# Patient Record
Sex: Female | Born: 1965 | Race: White | Hispanic: No | Marital: Married | Smoking: Never smoker
Health system: Southern US, Community
[De-identification: ages and names within clinical notes are randomized; demographics above are authoritative.]

## PROBLEM LIST (undated history)

## (undated) DIAGNOSIS — M4300 Spondylolysis, site unspecified: Secondary | ICD-10-CM

## (undated) DIAGNOSIS — R5382 Chronic fatigue, unspecified: Secondary | ICD-10-CM

## (undated) DIAGNOSIS — G9332 Myalgic encephalomyelitis/chronic fatigue syndrome: Secondary | ICD-10-CM

## (undated) DIAGNOSIS — R51 Headache: Secondary | ICD-10-CM

## (undated) DIAGNOSIS — Z5189 Encounter for other specified aftercare: Secondary | ICD-10-CM

## (undated) DIAGNOSIS — I82409 Acute embolism and thrombosis of unspecified deep veins of unspecified lower extremity: Secondary | ICD-10-CM

## (undated) DIAGNOSIS — R519 Headache, unspecified: Secondary | ICD-10-CM

## (undated) DIAGNOSIS — T7840XA Allergy, unspecified, initial encounter: Secondary | ICD-10-CM

## (undated) DIAGNOSIS — I10 Essential (primary) hypertension: Secondary | ICD-10-CM

## (undated) DIAGNOSIS — D689 Coagulation defect, unspecified: Secondary | ICD-10-CM

## (undated) DIAGNOSIS — E079 Disorder of thyroid, unspecified: Secondary | ICD-10-CM

## (undated) HISTORY — DX: Allergy, unspecified, initial encounter: T78.40XA

## (undated) HISTORY — DX: Essential (primary) hypertension: I10

## (undated) HISTORY — DX: Chronic fatigue, unspecified: R53.82

## (undated) HISTORY — DX: Disorder of thyroid, unspecified: E07.9

## (undated) HISTORY — PX: JOINT REPLACEMENT: SHX530

## (undated) HISTORY — DX: Coagulation defect, unspecified: D68.9

## (undated) HISTORY — DX: Myalgic encephalomyelitis/chronic fatigue syndrome: G93.32

## (undated) HISTORY — DX: Headache, unspecified: R51.9

## (undated) HISTORY — DX: Spondylolysis, site unspecified: M43.00

## (undated) HISTORY — DX: Encounter for other specified aftercare: Z51.89

## (undated) HISTORY — DX: Headache: R51

## (undated) HISTORY — DX: Acute embolism and thrombosis of unspecified deep veins of unspecified lower extremity: I82.409

## (undated) HISTORY — PX: OTHER SURGICAL HISTORY: SHX169

## (undated) HISTORY — PX: APPENDECTOMY: SHX54

---

## 1998-12-04 ENCOUNTER — Ambulatory Visit (HOSPITAL_COMMUNITY): Admission: RE | Admit: 1998-12-04 | Discharge: 1998-12-04 | Payer: Self-pay | Admitting: Internal Medicine

## 1998-12-04 ENCOUNTER — Encounter: Payer: Self-pay | Admitting: Internal Medicine

## 1999-01-08 ENCOUNTER — Other Ambulatory Visit: Admission: RE | Admit: 1999-01-08 | Discharge: 1999-01-08 | Payer: Self-pay | Admitting: Internal Medicine

## 1999-04-06 ENCOUNTER — Emergency Department (HOSPITAL_COMMUNITY): Admission: EM | Admit: 1999-04-06 | Discharge: 1999-04-06 | Payer: Self-pay | Admitting: Emergency Medicine

## 1999-08-27 ENCOUNTER — Emergency Department (HOSPITAL_COMMUNITY): Admission: EM | Admit: 1999-08-27 | Discharge: 1999-08-27 | Payer: Self-pay | Admitting: Emergency Medicine

## 1999-08-28 ENCOUNTER — Emergency Department (HOSPITAL_COMMUNITY): Admission: EM | Admit: 1999-08-28 | Discharge: 1999-08-28 | Payer: Self-pay | Admitting: Emergency Medicine

## 1999-09-18 ENCOUNTER — Emergency Department (HOSPITAL_COMMUNITY): Admission: EM | Admit: 1999-09-18 | Discharge: 1999-09-18 | Payer: Self-pay | Admitting: Emergency Medicine

## 1999-09-21 ENCOUNTER — Encounter: Admission: RE | Admit: 1999-09-21 | Discharge: 1999-09-21 | Payer: Self-pay | Admitting: Family Medicine

## 2000-01-27 ENCOUNTER — Emergency Department (HOSPITAL_COMMUNITY): Admission: EM | Admit: 2000-01-27 | Discharge: 2000-01-27 | Payer: Self-pay | Admitting: Emergency Medicine

## 2001-05-10 ENCOUNTER — Emergency Department (HOSPITAL_COMMUNITY): Admission: EM | Admit: 2001-05-10 | Discharge: 2001-05-10 | Payer: Self-pay

## 2001-05-10 ENCOUNTER — Encounter: Payer: Self-pay | Admitting: *Deleted

## 2001-08-08 ENCOUNTER — Other Ambulatory Visit: Admission: RE | Admit: 2001-08-08 | Discharge: 2001-08-08 | Payer: Self-pay | Admitting: Obstetrics & Gynecology

## 2001-08-17 ENCOUNTER — Encounter: Admission: RE | Admit: 2001-08-17 | Discharge: 2001-08-17 | Payer: Self-pay | Admitting: Obstetrics & Gynecology

## 2001-08-17 ENCOUNTER — Encounter: Payer: Self-pay | Admitting: Obstetrics & Gynecology

## 2001-11-06 ENCOUNTER — Encounter: Payer: Self-pay | Admitting: Internal Medicine

## 2001-11-06 ENCOUNTER — Encounter: Admission: RE | Admit: 2001-11-06 | Discharge: 2001-11-06 | Payer: Self-pay | Admitting: Internal Medicine

## 2001-12-18 ENCOUNTER — Ambulatory Visit (HOSPITAL_BASED_OUTPATIENT_CLINIC_OR_DEPARTMENT_OTHER): Admission: RE | Admit: 2001-12-18 | Discharge: 2001-12-18 | Payer: Self-pay | Admitting: Orthopedic Surgery

## 2002-12-27 ENCOUNTER — Other Ambulatory Visit: Admission: RE | Admit: 2002-12-27 | Discharge: 2002-12-27 | Payer: Self-pay | Admitting: Obstetrics & Gynecology

## 2003-12-04 ENCOUNTER — Emergency Department (HOSPITAL_COMMUNITY): Admission: EM | Admit: 2003-12-04 | Discharge: 2003-12-05 | Payer: Self-pay | Admitting: Emergency Medicine

## 2003-12-18 ENCOUNTER — Ambulatory Visit: Payer: Self-pay | Admitting: Internal Medicine

## 2003-12-19 ENCOUNTER — Ambulatory Visit: Payer: Self-pay | Admitting: Internal Medicine

## 2005-06-04 ENCOUNTER — Emergency Department (HOSPITAL_COMMUNITY): Admission: EM | Admit: 2005-06-04 | Discharge: 2005-06-04 | Payer: Self-pay | Admitting: Emergency Medicine

## 2005-10-31 ENCOUNTER — Emergency Department (HOSPITAL_COMMUNITY): Admission: EM | Admit: 2005-10-31 | Discharge: 2005-10-31 | Payer: Self-pay | Admitting: Emergency Medicine

## 2006-04-15 ENCOUNTER — Ambulatory Visit (HOSPITAL_BASED_OUTPATIENT_CLINIC_OR_DEPARTMENT_OTHER): Admission: RE | Admit: 2006-04-15 | Discharge: 2006-04-16 | Payer: Self-pay | Admitting: Orthopedic Surgery

## 2006-06-19 ENCOUNTER — Inpatient Hospital Stay (HOSPITAL_COMMUNITY): Admission: AD | Admit: 2006-06-19 | Discharge: 2006-06-19 | Payer: Self-pay | Admitting: Obstetrics & Gynecology

## 2007-01-08 ENCOUNTER — Inpatient Hospital Stay (HOSPITAL_COMMUNITY): Admission: AD | Admit: 2007-01-08 | Discharge: 2007-01-08 | Payer: Self-pay | Admitting: Obstetrics & Gynecology

## 2007-02-02 ENCOUNTER — Ambulatory Visit (HOSPITAL_COMMUNITY): Admission: RE | Admit: 2007-02-02 | Discharge: 2007-02-02 | Payer: Self-pay | Admitting: Obstetrics & Gynecology

## 2007-03-11 ENCOUNTER — Inpatient Hospital Stay (HOSPITAL_COMMUNITY): Admission: AD | Admit: 2007-03-11 | Discharge: 2007-03-11 | Payer: Self-pay | Admitting: Obstetrics & Gynecology

## 2007-08-17 ENCOUNTER — Observation Stay (HOSPITAL_COMMUNITY): Admission: AD | Admit: 2007-08-17 | Discharge: 2007-08-18 | Payer: Self-pay | Admitting: Obstetrics & Gynecology

## 2007-08-18 ENCOUNTER — Other Ambulatory Visit: Payer: Self-pay | Admitting: Obstetrics & Gynecology

## 2010-04-26 ENCOUNTER — Encounter: Payer: Self-pay | Admitting: Obstetrics & Gynecology

## 2010-08-18 NOTE — Op Note (Signed)
NAMECYNDIA, Melinda Ramirez             ACCOUNT NO.:  [id number]   MEDICAL RECORD NO.:  [id number]          PATIENT TYPE:  AMB   LOCATION:  SDC                           FACILITY:  [facility]   PHYSICIAN:  Genia Del, M.D.DATE OF BIRTH:  [birth year]   DATE OF PROCEDURE:  08/18/2007  DATE OF DISCHARGE:                               OPERATIVE REPORT   PREOPERATIVE DIAGNOSES:  A 12 plus weeks' gestation, abnormal Ultra-  Screen, and rupture of membranes.   POSTOPERATIVE DIAGNOSES:  A 12 plus weeks' gestation, abnormal Ultra-  Screen, and rupture of membranes.   PROCEDURE:  Dilatation and evacuation.   SURGEON:  Genia Del, MD   ANESTHESIOLOGIST:  Angelica Pou, MD   PROCEDURE:  Under general anesthesia with endotracheal intubation, the  patient is in lithotomy position.  She is prepped with [employer] on  the suprapubic, vulvar, and vaginal areas and draped as usual.  The  bladder is catheterized.  The vaginal exam reveals an anteverted uterus,  about 12 cm, mobile, no adnexal mass.  We inserted the speculum in the  vagina.  The anterior lip of the cervix is grasped with a tenaculum.  We  make a paracervical block with lidocaine 1%, 20 mL.  Dilatation of the  cervix easily done up to Hegar dilator, #41.  We then used a #12 curved  suction curette.  The curette is inserted in the intrauterine cavity.  The placenta is suctioned first.  Products of conception corresponding  to 12 weeks are evacuated and sent to pathology and genetics.  We then  use a sharp curette and proceed with systematic curettage of the  intrauterine cavity on all surfaces.  The uterine sound is heard  throughout.  To confirm that the cavity is emptied, we use the  ultrasound, and a thin endometrial line is seen.  The instruments are  removed.  Hemostasis is adequate.  The estimated blood loss is 75 mL.  No complication occurs, and the patient is transferred to recovery room  in good stable  status.      Genia Del, M.D.  Electronically Signed     ML/MEDQ  D:  08/18/2007  T:  08/19/2007  Job:  [id number]

## 2010-08-21 NOTE — Op Note (Signed)
NAME:  Melinda Ramirez, Melinda Ramirez                       ACCOUNT NO.:  [id number]   MEDICAL RECORD NO.:  [id number]                   PATIENT TYPE:  AMB   LOCATION:  DSC                                  FACILITY:  [facility]   PHYSICIAN:  Harvie Junior, M.D.                DATE OF BIRTH:  [birth year]   DATE OF PROCEDURE:  12/18/2001  DATE OF DISCHARGE:                                 OPERATIVE REPORT   PREOPERATIVE DIAGNOSES:  Impingement, left shoulder, with questionable  labral tear.   POSTOPERATIVE DIAGNOSES:  1. Impingement, left shoulder, with questionable labral tear.  2. Sublabral hole, left shoulder.   PRINCIPAL PROCEDURE:  Anterolateral acromioplasty with release of CA  ligament and debridement of subacromial bursa.   SURGEON:  Harvie Junior, M.D.   ASSISTANT:  Currie Paris. Thedore Mins.   ANESTHESIA:  General.   BRIEF HISTORY AND PHYSICAL:  The patient is a 45 year old female with a long  history of having a falling injury onto her left shoulder.  She ultimately  was evaluated and felt to have some sort of a bruise versus impingement  versus rotator cuff tear.  Ultimately, she underwent MRI which showed that  the rotator cuff was intact.  She had a distal clavicle fracture where she  had some thickening of the rotator cuff.  We ultimately discussed the  treatment options with her at that point, including rest and physical  therapy and felt that rest would be the most appropriate course of action,  given that she did have a distal clavicle fracture.  The patient continued  to have significant pain.  Because of her continuous, unrelenting pain, we  ultimately discussed injection therapy.  She did not wish to have steroid  injection of the shoulder, so we ultimately injected her with a local  anesthetic.  She got complete and excellent relief with a Marcaine block in  the subacromial space.  Because of this pain relief, we ultimately did a  second injection, and she got similar pain  relief.  Because of continued  complaints of pain and her fear of having a prolonged recovery and  ultimately needing surgery, she was ultimately taken to the operating room  for surgical intervention procedure.   DESCRIPTION OF PROCEDURE:  The patient was taken to the operating room after  adequate anesthesia was obtained.  The patient was placed on the operating  table.  The left arm was then examined under anesthesia.  The examination  under anesthesia showed that there was no tendency towards dislocation or  subluxation.  She did have the appropriate translation of the shoulder.  This was compared to the opposite side, and the two shoulders appeared to  have similar amounts of anterior translation.  At this point, attention was  turned to the left shoulder which was prepped and draped in the usual  sterile fashion.  Following this, routine arthroscopy revealed that there  was an obvious laxity to the glenohumeral joint.  Ultimately, there was a  positive drop-through sign.  The labral tube structures were evaluated and  felt to be intact; otherwise, a sublabral hole anteriorly, but we could  easily put the arm through a range of motion and felt a tightening on the  middle glenohumeral ligament of this attachment of the labrum and felt that  this was most appropriate to be left alone.  The biceps tendon was evaluated  thoroughly and noted to be intact and also functioning fine.  The posterior  labrum was evaluated with intact glenohumeral joint and no evidence of  arthritis.  The rotator cuff was evaluated and noted to be intact with no  evidence of undersurface fraying.  At this point, the cannula was removed  from the glenohumeral joint and turned into the subacromial space.  There  was a quite tenacious subacromial bursa.  The CA ligament was released, and  the subacromial bursectomy was performed.  It was a fairly tenacious, thick  veil with some popping and catching, especially out  laterally.  An  anterolateral acromioplasty was then performed from both the lateral portal  as well as the anterior portal.  The distal clavicle was not exposed, given  that there was no impingement from the distal clavicle and no preoperative  AC arthritis.  At this point, the rotator cuff was evaluated thoroughly from  the superior surface, and no evidence of rents or tears.  A final check was  made of the acromioplasty.  Debridement was undertaken of the bursa  anterior, posterior, and out laterally.  Once the bursectomy had been  completed, the patient was taken to the recovery room after closure of her  portals with nylon interrupted sutures.  A sterile compressive dressing was  applied.  She was taken to the recovery room and was noted to be in  satisfactory condition.                                                Harvie Junior, M.D.    Ranae Plumber  D:  12/18/2001  T:  12/18/2001  Job:  [id number]

## 2010-08-21 NOTE — Op Note (Signed)
Melinda Ramirez, Melinda Ramirez             ACCOUNT NO.:  [id number]   MEDICAL RECORD NO.:  [id number]          PATIENT TYPE:  AMB   LOCATION:  DSC                          FACILITY:  [facility]   PHYSICIAN:  Harvie Junior, M.D.   DATE OF BIRTH:  14-Oct-1965   DATE OF PROCEDURE:  04/15/2006  DATE OF DISCHARGE:                               OPERATIVE REPORT   PREOPERATIVE DIAGNOSES:  Impingement and acromioclavicular joint  arthritis.   POSTOPERATIVE DIAGNOSES:  1. Impingement.  2. Acromioclavicular joint arthritis.  3. Anterior and posterior labral tear.   PRINCIPLE PROCEDURES:  1. Arthroscopic subacromial decompression.  2. Distal clavicle resection.  3. Debridement within the glenohumeral joint of undersurface rotator      cuff tear, anterior labral tear and posterior labral tear.   SURGEON:  Harvie Junior, M.D.   ASSISTANT:  Marshia Ly, P.A.   ANESTHESIA:  General.   BRIEF HISTORY:  Melinda Ramirez is a 45 year old female with a long history  of having had a left shoulder arthroscopy in the past.  She ultimately  had been complaining of increasing pain in the right shoulder.  She had  an MRI, which showed no rotator cuff tear, but impingement and AC joint  narrowing.  We talked about treatment options and ultimately felt that a  subacromial decompression would be appropriate, given that she had  failed injection therapy and activity modification.  The patient was  taken to the operating room for this procedure.   PROCEDURES:  The patient was taken to the operating room, and after  adequate anesthesia was obtained with a general anesthetic, the patient  was placed on the operating table, and the right shoulder was prepped  and draped in the usual sterile fashion after the patient had been moved  into the beach-chair position and all bony prominences had been well  padded.  Following this, the shoulder was prepped and draped in the  usual sterile fashion, care being taken to avoid  Betadine, as there was  some question about skin irritation with this in the past.  Routine  arthroscopic examination of the shoulder then revealed there was no  significant glenohumeral arthritis.  There was an undersurface rotator  cuff tear at the leading edge of the supraspinatus, which was debrided  back to bleeding tissue, and there really at that point did not appear  to be any high-grade partial tear or full-thickness tear.  At this  point, attention was turned to the labrum.  The anterior labrum and the  midbody had some fraying, which was debrided.  The posterior superior  labrum had some fraying, which was debrided.  The glenohumeral joint was  evaluated again and the biceps tendon looked pristine.  At this point,  attention was turned now to the glenohumeral joint and subacromial  space.  A subtotal bursectomy was performed.  An anterolateral  acromioplasty was performed from the lateral and posterior compartment  with a cutting block technique.  Attention was then turned to the distal  clavicle for  distal clavicle resection of 17 mm through a lateral and anterior  portal.  At this point, a shaver was used to shave off all the bone dust  that was in the shoulder and do a subtotal bursectomy.  The patient was  then taken to recovery, where she was noted to be in a satisfactory  condition.  The estimated blood loss for the procedure was negligible.      Harvie Junior, M.D.  Electronically Signed     JLG/MEDQ  D:  04/15/2006  T:  04/16/2006  Job:  [id number]

## 2010-12-30 LAB — CBC
Hemoglobin: 13.6
MCV: 89.7
RBC: 4.35
RDW: 13.4
WBC: 7.2

## 2012-02-18 DIAGNOSIS — Z7901 Long term (current) use of anticoagulants: Secondary | ICD-10-CM | POA: Insufficient documentation

## 2012-07-24 DIAGNOSIS — E785 Hyperlipidemia, unspecified: Secondary | ICD-10-CM | POA: Insufficient documentation

## 2012-07-27 DIAGNOSIS — M47817 Spondylosis without myelopathy or radiculopathy, lumbosacral region: Secondary | ICD-10-CM | POA: Insufficient documentation

## 2012-07-27 DIAGNOSIS — M51369 Other intervertebral disc degeneration, lumbar region without mention of lumbar back pain or lower extremity pain: Secondary | ICD-10-CM | POA: Insufficient documentation

## 2012-07-27 DIAGNOSIS — M47812 Spondylosis without myelopathy or radiculopathy, cervical region: Secondary | ICD-10-CM | POA: Insufficient documentation

## 2012-07-27 DIAGNOSIS — M5136 Other intervertebral disc degeneration, lumbar region: Secondary | ICD-10-CM | POA: Insufficient documentation

## 2013-01-10 DIAGNOSIS — F518 Other sleep disorders not due to a substance or known physiological condition: Secondary | ICD-10-CM | POA: Insufficient documentation

## 2013-01-10 DIAGNOSIS — G8929 Other chronic pain: Secondary | ICD-10-CM | POA: Insufficient documentation

## 2013-05-24 DIAGNOSIS — G4710 Hypersomnia, unspecified: Secondary | ICD-10-CM | POA: Insufficient documentation

## 2013-07-24 DIAGNOSIS — M797 Fibromyalgia: Secondary | ICD-10-CM | POA: Insufficient documentation

## 2013-11-06 LAB — PULMONARY FUNCTION TEST

## 2013-11-14 ENCOUNTER — Ambulatory Visit (INDEPENDENT_AMBULATORY_CARE_PROVIDER_SITE_OTHER)
Admission: RE | Admit: 2013-11-14 | Discharge: 2013-11-14 | Disposition: A | Discharge status: Home / Self Care | Payer: 59 | Source: Ambulatory Visit | Attending: Internal Medicine | Admitting: Internal Medicine

## 2013-11-14 ENCOUNTER — Other Ambulatory Visit (INDEPENDENT_AMBULATORY_CARE_PROVIDER_SITE_OTHER): Payer: 59

## 2013-11-14 ENCOUNTER — Encounter: Payer: Self-pay | Admitting: Internal Medicine

## 2013-11-14 ENCOUNTER — Ambulatory Visit (INDEPENDENT_AMBULATORY_CARE_PROVIDER_SITE_OTHER): Payer: 59 | Admitting: Internal Medicine

## 2013-11-14 ENCOUNTER — Ambulatory Visit (INDEPENDENT_AMBULATORY_CARE_PROVIDER_SITE_OTHER): Payer: 59 | Admitting: Emergency Medicine

## 2013-11-14 ENCOUNTER — Telehealth: Payer: Self-pay | Admitting: Internal Medicine

## 2013-11-14 VITALS — BP 134/86 | HR 80 | Temp 98.0°F | Ht 64.5 in | Wt 214.2 lb

## 2013-11-14 VITALS — BP 120/85 | HR 125 | Temp 97.5°F | Resp 18 | Ht 64.5 in | Wt 211.8 lb

## 2013-11-14 DIAGNOSIS — G4700 Insomnia, unspecified: Secondary | ICD-10-CM | POA: Insufficient documentation

## 2013-11-14 DIAGNOSIS — R0989 Other specified symptoms and signs involving the circulatory and respiratory systems: Secondary | ICD-10-CM

## 2013-11-14 DIAGNOSIS — E039 Hypothyroidism, unspecified: Secondary | ICD-10-CM

## 2013-11-14 DIAGNOSIS — R0609 Other forms of dyspnea: Secondary | ICD-10-CM

## 2013-11-14 DIAGNOSIS — R0902 Hypoxemia: Secondary | ICD-10-CM | POA: Insufficient documentation

## 2013-11-14 DIAGNOSIS — Z86711 Personal history of pulmonary embolism: Secondary | ICD-10-CM

## 2013-11-14 DIAGNOSIS — I82409 Acute embolism and thrombosis of unspecified deep veins of unspecified lower extremity: Secondary | ICD-10-CM

## 2013-11-14 DIAGNOSIS — R000 Tachycardia, unspecified: Secondary | ICD-10-CM

## 2013-11-14 DIAGNOSIS — R0600 Dyspnea, unspecified: Secondary | ICD-10-CM

## 2013-11-14 DIAGNOSIS — G894 Chronic pain syndrome: Secondary | ICD-10-CM | POA: Insufficient documentation

## 2013-11-14 LAB — POCT CBC
GRANULOCYTE PERCENT: 53 % (ref 37–80)
HEMATOCRIT: 43.4 % (ref 37.7–47.9)
Hemoglobin: 14 g/dL (ref 12.2–16.2)
Lymph, poc: 2.2 (ref 0.6–3.4)
MCH: 29.9 pg (ref 27–31.2)
MCHC: 32.4 g/dL (ref 31.8–35.4)
MCV: 92.5 fL (ref 80–97)
MID (CBC): 0.4 (ref 0–0.9)
MPV: 6.7 fL (ref 0–99.8)
POC Granulocyte: 2.9 (ref 2–6.9)
POC LYMPH PERCENT: 40.4 %L (ref 10–50)
POC MID %: 6.6 %M (ref 0–12)
Platelet Count, POC: 231 10*3/uL (ref 142–424)
RBC: 4.69 M/uL (ref 4.04–5.48)
RDW, POC: 13.4 %
WBC: 5.4 10*3/uL (ref 4.6–10.2)

## 2013-11-14 LAB — BASIC METABOLIC PANEL
BUN: 10 mg/dL (ref 6–23)
CALCIUM: 9.4 mg/dL (ref 8.4–10.5)
CO2: 25 meq/L (ref 19–32)
CREATININE: 0.7 mg/dL (ref 0.4–1.2)
Chloride: 99 mEq/L (ref 96–112)
GFR: 100.61 mL/min (ref >60.00)
GLUCOSE: 105 mg/dL — AB (ref 70–99)
POTASSIUM: 4.1 meq/L (ref 3.5–5.1)
Sodium: 133 mEq/L — ABNORMAL LOW (ref 135–145)

## 2013-11-14 LAB — TSH: TSH: 0.2 u[IU]/mL — ABNORMAL LOW (ref 0.35–4.50)

## 2013-11-14 LAB — D-DIMER, QUANTITATIVE: D-Dimer, Quant: 0.27 ug/mL-FEU (ref 0.00–0.48)

## 2013-11-14 LAB — BRAIN NATRIURETIC PEPTIDE: Pro B Natriuretic peptide (BNP): 15 pg/mL (ref 0.0–100.0)

## 2013-11-14 NOTE — Assessment & Plan Note (Signed)
No evidence active clot at present though clearly at risk based on freq travel, obesty, and h/o DVT

## 2013-11-14 NOTE — Progress Notes (Signed)
Subjective:    Patient ID: Melinda Ramirez, female    DOB: [birth year]  MRN: [id number]  HPI  [id number] yowf never smoker / [employer] in environmental science on prn lovenox per [facility] with hx 1st week of Aug 3rd 2015 02 dropped / pulse up / dizzy with hbp > [facility] rx with 02 and nl v/q and bubble study all reported nl and d/c off 02, not change rx,  Felt better but not very active then am 11/14/2013 felt same sensation with low 02 sats with nl D dimer at Dr Ellis Parentsaub's office so sent to pulmonary clinic 11/14/2013   Last simlar episode June 2015 recurred  over sev days and resolved on its own.  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 Developed right leg swelling and pain 17 days after an arthroscopic right ACL repair. ii. Bilateral leg [facility] showed the presence of DVT from the right mid-femoral to the posterior tibial vein.  (1) V/Q scan reportedly interpreted as "negative" for PE iii. Was admitted, started on enoxaparin 05/28/09 and then warfarin 05/29/09.  iv. Due to her clot burden and travel requirements, was recommended to undergo thrombolysis/thrombectomy.  v. Was [facility] and treatment.  vi. Ultrasound showed the presence of an occlusive thrombus in the right popliteal vein, without thrombosis in the left leg, 06/01/09 vi. MRV showed compression of the left common iliac vein with proximal focal dilatation and compression of the right common iliac vein by the uterus and a right 2.1 cm adnexal cyst.  vii. Bilateral ultrasound performed 06/04/09 (1) Right leg showed the presence of deep vein thrombosis in the right mid-/distal femoral, popliteal, posterior  tibial, and peroneal veins (2) Left leg showed no thrombosis.  viii. Limited thrombophilia evaluation unrevealing: (1) No evidence of activated protein C resistance.  (2) Antithrombin activity 99% (3) No evidence of a lupus anticoagulant or anticardiolipin antibodies ix. Due to persistent symptoms while on therapeutic warfarin, decision made to pursue placement of an IVC filter, popliteal venogram with thrombectomy and thrombolysis.  x. Bilateral leg ultrasound performed, 06/26/09 (1) Right leg showed incompetent deep venous system without an acute DVT and chronic, recanalized popliteal DVT.  (2) Left leg showed a normal study xi. Developed persistent abdominal pain without any overt findings on repeated CT scans of the abdomen, felt that perhaps misalignment of the IVC filter might be contributing to her symptoms of severe pain and radiculopathy. (1) Noted that the prongs of the filter were penetrating the IVC wall and were in close proximity to the lumbar vertebrae, without evidence of hemorrhage or erosion.  xii. Underwent removal of the IVC filter with subsequent chronic radicular pain, 06/28/09 xiii. Right lower extremity ultrasound showed evidence of chronic deep venous thrombosis only in one of the duplicated popliteal veins with chronic appearing nonocclusive thrombus with linear septation and incomplete color fill and compression. Otherwise the deep and superficial venous systems including calf veins were patent without evidence of thrombosis, 11/03/2009 xiv. Subsequent venogram showed no stenosis leading to discontinuation of warfarin by Vascular Surgery, 03/2010. xv. Ultrasound was negative for DVT in the left leg 10/17/2010. xvi. Repeat bilateral lower extremity ultrasound showed no evidence of venous thrombosis, 02/08/2011. xvii. Evaluated in  the Hematology Clinic, where it was felt that based upon her history, physical examination, and the available laboratory data, there was no reason  to warrant resuming therapeutic anticoagulation, 03/24/11 (1) Recommend thromboprophylaxis in settings of increased thrombotic risk.  xviii. MRV showed no evidence for acute or chronic deep venous thrombosis in the abdomen, pelvis, and bilateral lower extremities, 06/05/11. (1) Incidental note made of right greater than left prominent superficial veins in the proximal calves and distal thighs without evidence for central venous stenosis. b. 2nd episode, 01/22/12 i. Developed cough and dyspnea the day following the RFA of her left GSV. ii. Presented to [facility] and noted to have a room air pulse oximetry of 95% and referred to the nearest ER.  iii. Patient presented to the [facility] ER and noted to have a very low probability V/Q scan.  iv. Ultrasound of the left leg showed evidence of acute obstruction in the left common femoral vein, with left GSV thrombus noted after ablation.  v. Was started on unfractionated heparin, transitioned to enoxaparin, and then bridged to warfarin as an out-patient. vi. Follow-up ultrasound of the left leg showed no evidence of acute or chronic deep or superficial vein thrombosis, with the segment of the great saphenous vein which was  treated being occluded, 02/14/12 vii. Warfarin stopped 03/08/12      11/14/2013 1st [facility] office visit/ Ennio Houp  Chief Complaint  Patient presents with  . Pulmonary Consult    Referred by Dr. Lesle Chris.  Pt has hx of PE and DVT- fall 2013.  She states that she has occ hypoxia.    on macrodantin prn post coital and last took it July 16  Walked an hour one day prior to OV  s difficulty but did not check sats Prev when blood clot dx was made at [facility] based on clot in leg and low 02 level but no def finding on V/Q  No obvious other patterns in day to day or daytime variabilty or assoc chronic cough or cp or chest tightness, subjective wheeze overt sinus or hb symptoms. No unusual exp hx or h/o childhood pna/ asthma or  knowledge of premature birth.  Sleeping ok without nocturnal  or early am exacerbation  of respiratory  c/o's or need for noct saba. Also denies any obvious fluctuation of symptoms with weather or environmental changes or other aggravating or alleviating factors except as outlined above   Current Medications, Allergies, Complete Past Medical History, Past Surgical History, Family History, and Social History were reviewed in [employer] record.           Review of Systems  Constitutional: Negative for fever, chills and unexpected weight change.  HENT: Negative for congestion, dental problem, ear pain, nosebleeds, postnasal drip, rhinorrhea, sinus pressure, sneezing, sore throat, trouble swallowing and voice change.   Eyes: Negative for visual disturbance.  Respiratory: Negative for cough, choking and shortness of breath.   Cardiovascular: Positive for palpitations. Negative for chest pain and leg swelling.  Gastrointestinal: Negative for vomiting, abdominal pain and diarrhea.  Genitourinary: Negative for difficulty urinating.  Musculoskeletal: Positive for arthralgias.  Skin: Negative for rash.  Neurological: Negative for tremors, syncope and headaches.  Hematological: Does not bruise/bleed easily.       Objective:   Physical Exam  Wt Readings from Last 3 Encounters:  11/14/13 214 lb 3.2 oz (97.16 kg)  11/14/13 211 lb 12.8 oz (96.072 kg)     amb wf nad  HEENT: nl dentition,  turbinates, and orophanx. Nl external ear canals without cough reflex   NECK :  without JVD/Nodes/TM/ nl carotid upstrokes bilaterally   LUNGS: no acc muscle use, clear to A and P bilaterally without cough on insp or exp maneuvers   CV:  RRR  no s3 or murmur or increase in P2, no edema   ABD:  soft and nontender with nl excursion in the supine position. No bruits or organomegaly, bowel sounds nl  MS:  warm without deformities, calf tenderness, cyanosis or clubbing  SKIN: warm  and dry without lesions    NEURO:  alert, approp, no deficits    CXR  11/14/2013 :   The heart size and mediastinal contours are within normal limits.  Both lungs are clear. The visualized skeletal structures are  unremarkable.      Assessment & Plan:

## 2013-11-14 NOTE — Assessment & Plan Note (Signed)
11/14/2013 TSH  0.20 on 60 gr of thyroid > referred back to Dr Cleta Albertsaub for adjustment

## 2013-11-14 NOTE — Patient Instructions (Addendum)
It is unlikely that you now have a blood clot but you have a high risk of having another one and we have no other explanation for your recurrent clots and to prevent them you should be on xarelto indefinitely and hope that you will reconsider and see your hematology doctors at [facility] in concert with a pulmonary opinion there as well (we could set this up after I have a chance to review your [place] records)   We will request your echo report/ VQ results and cxr reports/ your PFT's from [place]  I would avoid further macrodantin indefinitely   Work on wt loss by paced exercise   Please remember to go to the lab and x-ray department downstairs for your tests - we will call you with the results when they are available.     .Marland Kitchen

## 2013-11-14 NOTE — Assessment & Plan Note (Addendum)
11/14/2013  Walked RA x 3 laps @ 185 ft each stopped due to  End of study, sat 88, minimal symptoms of sob/ lightheaded, recovered quickly - w/u in [place] 1st week of Aug 2015 requested   Interesting aspect of hx is absence of desats while on anticoagulation per pt hx and now episodic desats with nl d dimer.  While a  nl valute  may miss small peripheral pe, the clot burden with sob/ desats is moderately high and the d dimer has a very high neg pred value in this setting.  She really needs a non contrasted HRCT to explain this in the absence of any evidence of PAH or R to L shunt on echo in [place] and regardless of whether PE explains her problem, she is at risk of very catstrophic PE from future potential events as can't even keep her sats up well in the absence of proof of them now.   I strongly rec she return to [facility] heme and pulmonary dept's for a re-eval or go ahead and restart rx in form of xarelto now but she declined both.

## 2013-11-14 NOTE — Progress Notes (Addendum)
This chart was scribed for Collene GobbleSteven A Draedyn Weidinger, MD by Charline BillsEssence Howell, ED Scribe. The patient was seen in room 11. Patient's care was started at 9:02 AM.  Subjective:    Patient ID: Melinda Ramirez, female    DOB: 1965-11-17, 48 y.o.   MRN: [id number]  Chief Complaint  Patient presents with  . other    pt would like to see why her o2 level is low.  when pt came in her o2 was at 7587.  It eventually went up to 97  . Headache    x3 weeks   HPI HPI Comments: Melinda ClearJessica M Ramirez is a 48 y.o. female, with a h/o clotting disorder and HTN, who presents to the [facility] complaining of intermittent HA over the past 3 weeks. Pt has followed up with her neurologist regarding HAs that he attributes to fluctuations in BP. He advised pt tp follow up with PCP and begin propranolol.   Pt states that she has had a few DVTs since she was last seen. She states that she had IVC filters which resulted in spinal and nerve damage. Pt reports a 3 ft long blood clot from her abdomen to her ankle. She states that the filter was placed and removed 1 ft of the clot but the rest of the clot remained above the knee. She reports her second blood clot in her thigh which caused fatigue, hallucinations and an O2 of 84. Pt's third blood clot was in a superficial vein following a flight. Pt states that she stops every hour to walk for 10 minutes when she is driving to prevent clots. Pt suspects floating microemboli. Her blood clots are managed by [facility] coagulation clinic. Pt is not currently taking blood thinners.  Pt was hospitalized a few days ago in [place] for dizziness, low O2 levels and increased pulse. Pt had an echo, EKG and bubble study done that did not show a shunt. Pt states that she has an oxygen monitor at home with readings that Ramirez from 86-99. Pt's O2 level was 87 when she came in today but states that her O2 levels improved to 97 after taking a few deep breaths. Pt states that she has had a pulmonary  function test that showed great lung capacity. She also states that albuterol does not improve her O2 levels.  Pt also reports insomnia and chronic fatigue which was managed by neuropsychiatrist Dr. Nedra HaiLee in [place]; pt no longer sees him. Pt's pain management in [place]. No cardiologist per patient.    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 Although she is currently disabled, she expresses her desire to return to work. Pt states that she has been traveling and living in different areas such as [place], [place], [place] and recently moved back to [place].   Past Medical History  Diagnosis Date  . Allergy   . Blood transfusion without reported diagnosis   . Clotting disorder   . Hypertension   . Thyroid disease    No current outpatient prescriptions on file prior to visit.   No current facility-administered medications on file prior to visit.   Allergies  Allergen Reactions  . Gadolinium Derivatives   . Iohexol      Code: HIVES   . Prednisone Hives  . Sulfur Rash   Review of Systems  Constitutional: Positive for fatigue (chronic).  Neurological: Positive for headaches.  Psychiatric/Behavioral: Positive for sleep disturbance (chronic).  Objective:   Physical Exam CONSTITUTIONAL: Well developed/well nourished HEAD: Normocephalic/atraumatic EYES: EOMI/PERRL ENMT: Mucous membranes moist NECK: supple no meningeal signs SPINE: entire spine nontender CV: S1/S2 noted, no murmurs/rubs/gallops noted LUNGS: Lungs are Ramirez to auscultation bilaterally, no apparent distress ABDOMEN: soft, nontender, no rebound or guarding GU: no cva tenderness NEURO: Pt is awake/alert, moves all extremitiesx4 EXTREMITIES: pulses normal, full ROM SKIN: warm, color normal PSYCH: no abnormalities of mood noted Results for orders placed in visit on 11/14/13  POCT CBC      Result Value Ref Ramirez   WBC 5.4  4.6 - 10.2 K/uL   Lymph, poc 2.2  0.6 - 3.4   POC LYMPH  PERCENT 40.4  10 - 50 %L   MID (cbc) 0.4  0 - 0.9   POC MID % 6.6  0 - 12 %M   POC Granulocyte 2.9  2 - 6.9   Granulocyte percent 53.0  37 - 80 %G   RBC 4.69  4.04 - 5.48 M/uL   Hemoglobin 14.0  12.2 - 16.2 g/dL   HCT, POC 16.1  09.6 - 47.9 %   MCV 92.5  80 - 97 fL   MCH, POC 29.9  27 - 31.2 pg   MCHC 32.4  31.8 - 35.4 g/dL   RDW, POC 04.5     Platelet Count, POC 231  142 - 424 K/uL   MPV 6.7  0 - 99.8 fL      Assessment & Plan:ot  Very complicated history. She has had 3 episodes of clots.. She had previous placement of a vena cava filter with complications. She was  hospitalized last week because of tachycardia and hypoxia. No definite etiology was found however showering of emboli was considered. Apparently she did have an echo and no definite PFO was found. She has been followed at the coag clinic at [facility]. Still would worry but the possibility of showering emboli or a right to left shift. When she was ambulated her heart rate rose to between 110 and 120 with a drop in oxygen to 85%. We'll try and get an opinion from the pulmonologist regarding further management and evaluation. Stat CBC and d-dimer were done.. she will see Dr. Sherene Sires at 1:30 I personally performed the services described in this documentation, which was scribed in my presence. The recorded information has been reviewed and is accurate.

## 2013-11-15 NOTE — Progress Notes (Signed)
Quick Note:  Spoke with pt and notified of results per Dr. Wert. Pt verbalized understanding and denied any questions.  ______ 

## 2013-11-16 NOTE — Telephone Encounter (Signed)
Please advise if this can be signed off MW thanks

## 2013-11-19 NOTE — Telephone Encounter (Signed)
Will sign off--

## 2013-11-19 NOTE — Telephone Encounter (Signed)
Yes. Aware, ok to sign off

## 2013-11-26 ENCOUNTER — Encounter: Payer: Self-pay | Admitting: Internal Medicine

## 2013-11-26 ENCOUNTER — Telehealth: Payer: Self-pay

## 2013-11-26 ENCOUNTER — Other Ambulatory Visit: Payer: Self-pay | Admitting: Internal Medicine

## 2013-11-26 DIAGNOSIS — R0902 Hypoxemia: Secondary | ICD-10-CM

## 2013-11-26 NOTE — Telephone Encounter (Signed)
Release form faxed to [facility] medical records at fax # [phone] with confirmation.

## 2013-11-26 NOTE — Telephone Encounter (Signed)
Left message for call back. Due the release form not being completed correctly, the form has not been sent to [facility]. I called patient on 11/22/13 and left message for her to call back so we could have clarification on where we needed to fax her request. I did look up [facility] in [place] to and got medical records phone number. Will call for fax number to send release form.

## 2013-11-26 NOTE — Telephone Encounter (Signed)
Pt states she signed a release for [place] to retrieve her records from [facility] and wanted to know the status, please call pt at [phone] and the phone number to [facility] Is [phone] she doesn't know the fax

## 2013-12-03 ENCOUNTER — Other Ambulatory Visit (HOSPITAL_COMMUNITY): Payer: Medicare Other

## 2013-12-11 ENCOUNTER — Encounter: Payer: Self-pay | Admitting: Internal Medicine

## 2013-12-11 ENCOUNTER — Ambulatory Visit (HOSPITAL_COMMUNITY): Payer: 59 | Attending: Cardiology | Admitting: Cardiology

## 2013-12-11 ENCOUNTER — Encounter (HOSPITAL_COMMUNITY): Payer: Self-pay

## 2013-12-11 ENCOUNTER — Other Ambulatory Visit (HOSPITAL_COMMUNITY): Payer: Self-pay | Admitting: Internal Medicine

## 2013-12-11 DIAGNOSIS — I079 Rheumatic tricuspid valve disease, unspecified: Secondary | ICD-10-CM | POA: Diagnosis not present

## 2013-12-11 DIAGNOSIS — R0902 Hypoxemia: Secondary | ICD-10-CM | POA: Diagnosis not present

## 2013-12-11 DIAGNOSIS — R0602 Shortness of breath: Secondary | ICD-10-CM

## 2013-12-11 DIAGNOSIS — E669 Obesity, unspecified: Secondary | ICD-10-CM | POA: Insufficient documentation

## 2013-12-11 DIAGNOSIS — I059 Rheumatic mitral valve disease, unspecified: Secondary | ICD-10-CM | POA: Insufficient documentation

## 2013-12-11 NOTE — Progress Notes (Signed)
Patient ID: Melinda Ramirez, female   DOB: 09/26/1965, 48 y.o.   MRN: [id number] IV with 22 G angiocath (R) AC x 1, tolerated well, site unremarkable for Echo Bubble Study. Irean Hong, RN.

## 2013-12-11 NOTE — Progress Notes (Signed)
Echo performed. 

## 2013-12-13 ENCOUNTER — Encounter: Payer: Self-pay | Admitting: Internal Medicine

## 2013-12-13 ENCOUNTER — Ambulatory Visit (INDEPENDENT_AMBULATORY_CARE_PROVIDER_SITE_OTHER): Payer: 59 | Admitting: Internal Medicine

## 2013-12-13 VITALS — BP 124/90 | HR 78 | Temp 98.0°F | Ht 65.0 in | Wt 214.0 lb

## 2013-12-13 DIAGNOSIS — I82409 Acute embolism and thrombosis of unspecified deep veins of unspecified lower extremity: Secondary | ICD-10-CM

## 2013-12-13 DIAGNOSIS — Z23 Encounter for immunization: Secondary | ICD-10-CM

## 2013-12-13 DIAGNOSIS — R0902 Hypoxemia: Secondary | ICD-10-CM

## 2013-12-13 DIAGNOSIS — I10 Essential (primary) hypertension: Secondary | ICD-10-CM | POA: Insufficient documentation

## 2013-12-13 MED ORDER — PROPRANOLOL HCL ER 80 MG PO CP24: 80.0000 mg | ORAL_CAPSULE | Freq: Every day | ORAL | Status: DC

## 2013-12-13 MED ORDER — RIVAROXABAN 15 MG PO TABS: 15.0000 mg | ORAL_TABLET | Freq: Two times a day (BID) | ORAL | Status: DC

## 2013-12-13 MED ORDER — RIVAROXABAN 20 MG PO TABS: 20.0000 mg | ORAL_TABLET | Freq: Every day | ORAL | Status: DC

## 2013-12-13 NOTE — Assessment & Plan Note (Signed)
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 Developed right leg swelling and pain 17 days after an arthroscopic right ACL repair. ii. Bilateral leg ultrasound performed at [facility] showed the presence of DVT from the right mid-femoral to the posterior tibial vein.  (1) V/Q scan reportedly interpreted as "negative" for PE iii. Was admitted, started on enoxaparin 05/28/09 and then warfarin 05/29/09.  iv. Due to her clot burden and travel requirements, was recommended to undergo thrombolysis/thrombectomy.  v. Was transferred to [facility] for further evaluation and treatment.  vi. Ultrasound showed the presence of an occlusive thrombus in the right popliteal vein, without thrombosis in the left leg, 06/01/09 vi. MRV showed compression of the left common iliac vein with proximal focal dilatation and compression of the right common iliac vein by the uterus and a right 2.1 cm adnexal cyst.  vii. Bilateral ultrasound performed 06/04/09 (1) Right leg showed the presence of deep vein thrombosis in the right mid-/distal femoral, popliteal, posterior tibial, and peroneal veins (2) Left leg showed no thrombosis.  viii. Limited thrombophilia evaluation unrevealing: (1) No evidence of activated protein C resistance.  (2) Antithrombin activity 99% (3) No evidence of a lupus anticoagulant or anticardiolipin antibodies ix. Due to persistent symptoms while on therapeutic warfarin, decision made to pursue placement of an IVC filter, popliteal venogram with thrombectomy and thrombolysis.  x. Bilateral leg ultrasound performed, 06/26/09 (1) Right leg showed incompetent deep venous system without an acute DVT and chronic, recanalized popliteal DVT.  (2) Left leg showed a normal study xi. Developed persistent abdominal pain without any overt findings on repeated CT scans of the abdomen, felt that perhaps misalignment of the IVC filter might be contributing to her symptoms of severe pain and  radiculopathy. (1) Noted that the prongs of the filter were penetrating the IVC wall and were in close proximity to the lumbar vertebrae, without evidence of hemorrhage or erosion.  xii. Underwent removal of the IVC filter with subsequent chronic radicular pain, 06/28/09 xiii. Right lower extremity ultrasound showed evidence of chronic deep venous thrombosis only in one of the duplicated popliteal veins with chronic appearing nonocclusive thrombus with linear septation and incomplete color fill and compression. Otherwise the deep and superficial venous systems including calf veins were patent without evidence of thrombosis, 11/03/2009 xiv. Subsequent venogram showed no stenosis leading to discontinuation of warfarin by Vascular Surgery, 03/2010. xv. Ultrasound was negative for DVT in the left leg 10/17/2010. xvi. Repeat bilateral lower extremity ultrasound showed no evidence of venous thrombosis, 02/08/2011. xvii. Evaluated in the Hematology Clinic, where it was felt that based upon her history, physical examination, and the available laboratory data, there was no reason to warrant resuming therapeutic anticoagulation, 03/24/11 (1) Recommend thromboprophylaxis in settings of increased thrombotic risk.  xviii. MRV showed no evidence for acute or chronic deep venous thrombosis in the abdomen, pelvis, and bilateral lower extremities, 06/05/11. (1) Incidental note made of right greater than left prominent superficial veins in the proximal calves and distal thighs without evidence for central venous stenosis. b. 2nd episode, 01/22/12 i. Developed cough and dyspnea the day following the RFA of her left GSV. ii. Presented to [facility] and noted to have a room air pulse oximetry of 95% and referred to the nearest ER.  iii. Patient presented [facility] and noted to have a very low probability V/Q scan.  iv. Ultrasound of the left leg showed evidence of acute obstruction in the left common femoral  vein, with left  GSV thrombus noted after ablation.  v. Was started on unfractionated heparin, transitioned to enoxaparin, and then bridged to warfarin as an out-patient. vi. Follow-up ultrasound of the left leg showed no evidence of acute or chronic deep or superficial vein thrombosis, with the segment of the great saphenous vein which was  treated being occluded, 02/14/12 vii. Warfarin stopped 03/08/12  Lab Results  Component Value Date   DDIMER 0.27 11/14/2013    12/13/2013  Started xarelto prophylactically as pt declined to return to [facility] where they had been filling "prn lovenox" but with which I disagreed based on concern of chronic low grade ongoing clotting /risk for recurrence

## 2013-12-13 NOTE — Progress Notes (Signed)
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 rx with 02 and nl v/q and bubble study all reported nl and d/c off 02, not change rx,  Felt better but not very active then am 11/14/2013 felt same sensation with low 02 sats with nl D dimer at Dr Ellis Parentsaub's office so sent to pulmonary clinic 11/14/2013   Last simlar episode June 2015 recurred  over sev days and resolved on its own.  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 Developed right leg swelling and pain 17 days after an arthroscopic right ACL repair. ii. Bilateral leg ultrasound performed at [facility] showed the presence of DVT from the right mid-femoral to the posterior tibial vein.  (1) V/Q scan reportedly interpreted as "negative" for PE iii. Was admitted, started on enoxaparin 05/28/09 and then warfarin 05/29/09.  iv. Due to her clot burden and travel requirements, was recommended to undergo thrombolysis/thrombectomy.  v. Was transferred to [facility] for further evaluation and treatment.  vi. Ultrasound showed the presence of an occlusive thrombus in the right popliteal vein, without thrombosis in the left leg, 06/01/09 vi. MRV showed compression of the left common iliac vein with proximal focal dilatation and compression of the right common iliac vein by the uterus and a right 2.1 cm adnexal cyst.  vii. Bilateral ultrasound performed 06/04/09 (1) Right leg showed the presence of deep vein thrombosis in the right mid-/distal femoral, popliteal, posterior  tibial, and peroneal veins (2) Left leg showed no thrombosis.  viii. Limited thrombophilia evaluation unrevealing: (1) No evidence of activated protein C resistance.  (2) Antithrombin activity 99% (3) No evidence of a lupus anticoagulant or anticardiolipin antibodies ix. Due to persistent symptoms while on therapeutic warfarin, decision made to pursue placement of an IVC filter, popliteal venogram with thrombectomy and thrombolysis.  x. Bilateral leg ultrasound performed, 06/26/09 (1) Right leg showed incompetent deep venous system without an acute DVT and chronic, recanalized popliteal DVT.  (2) Left leg showed a normal study xi. Developed persistent abdominal pain without any overt findings on repeated CT scans of the abdomen, felt that perhaps misalignment of the IVC filter might be contributing to her symptoms of severe pain and radiculopathy. (1) Noted that the prongs of the filter were penetrating the IVC wall and were in close proximity to the lumbar vertebrae, without evidence of hemorrhage or erosion.  xii. Underwent removal of the IVC filter with subsequent chronic radicular pain, 06/28/09 xiii. Right lower extremity ultrasound showed evidence of chronic deep venous thrombosis only in one of the duplicated popliteal veins with chronic appearing nonocclusive thrombus with linear septation and incomplete color fill and compression. Otherwise the deep and superficial venous systems including calf veins were patent without evidence of thrombosis, 11/03/2009 xiv. Subsequent venogram showed no stenosis leading to discontinuation of warfarin by Vascular Surgery, 03/2010. xv. Ultrasound was negative for DVT in the left leg 10/17/2010. xvi. Repeat bilateral lower extremity ultrasound showed no evidence of venous thrombosis, 02/08/2011. xvii. Evaluated in  the Hematology Clinic, where it was felt that based upon her history, physical examination, and the available laboratory data, there was no reason  to warrant resuming therapeutic anticoagulation, 03/24/11 (1) Recommend thromboprophylaxis in settings of increased thrombotic risk.  xviii. MRV showed no evidence for acute or chronic deep venous thrombosis in the abdomen, pelvis, and bilateral lower extremities, 06/05/11. (1) Incidental note made of right greater than left prominent superficial veins in the proximal calves and distal thighs without evidence for central venous stenosis. b. 2nd episode, 01/22/12 i. Developed cough and dyspnea the day following the RFA of her left GSV. ii. Presented to [facility] and noted to have a room air pulse oximetry of 95% and referred to the nearest ER.  iii. Patient presented to the [facility] ER and noted to have a very low probability V/Q scan.  iv. Ultrasound of the left leg showed evidence of acute obstruction in the left common femoral vein, with left GSV thrombus noted after ablation.  v. Was started on unfractionated heparin, transitioned to enoxaparin, and then bridged to warfarin as an out-patient. vi. Follow-up ultrasound of the left leg showed no evidence of acute or chronic deep or superficial vein thrombosis, with the segment of the great saphenous vein which was  treated being occluded, 02/14/12 vii. Warfarin stopped 03/08/12      11/14/2013 1st [facility] office visit/ Melinda Ramirez  Chief Complaint  Patient presents with  . Pulmonary Consult    Referred by Dr. Lesle Chris.  Pt has hx of PE and DVT- fall 2013.  She states that she has occ hypoxia.    on macrodantin prn post coital and last took it July 16  Walked an hour one day prior to OV  s difficulty but did not check sats Prev when blood clot dx was made at [facility] based on clot in leg and low 02 level but no def finding on V/Q rec It is unlikely that you now have a blood clot but you have a high risk of having another one and we have no other explanation for your recurrent clots and to prevent them you should be on xarelto indefinitely  and hope that you will reconsider and see your hematology doctors at [facility] in concert with a pulmonary opinion there as well (we could set this up after I have a chance to review your [place] records)  We will request your echo report/ VQ results and cxr reports/ your PFT's from [place] I would avoid further macrodantin indefinitely  Work on wt loss by paced exercise    12/13/2013 f/u ov/Melinda Ramirez re: unexplained hypoxemia  Chief Complaint  Patient presents with  . Follow-up    Pt here to f/u on recent tests. She denies any new co's today.   doing better with paced exercise but still occ desats  Still driving 2 x monthly to [place] and wants lovenox but really would prefer xarelto (see a/p).    No obvious day to day or daytime variabilty or assoc chronic cough or cp or chest tightness, subjective wheeze overt sinus or hb symptoms. No unusual exp hx or h/o childhood pna/ asthma or knowledge of premature birth.  Sleeping ok without nocturnal  or early am exacerbation  of respiratory  c/o's or need for noct saba. Also denies any obvious fluctuation of symptoms with weather or environmental changes or other aggravating or alleviating factors except as outlined above   Current Medications, Allergies, Complete Past Medical History, Past Surgical History, Family History,  and Social History were reviewed in [employer] record.  ROS  The following are not active complaints unless bolded sore throat, dysphagia, dental problems, itching, sneezing,  nasal congestion or excess/ purulent secretions, ear ache,   fever, chills, sweats, unintended wt loss, pleuritic or exertional cp, hemoptysis,  orthopnea pnd or leg swelling, presyncope, palpitations, heartburn, abdominal pain, anorexia, nausea, vomiting, diarrhea  or change in bowel or urinary habits, change in stools or urine, dysuria,hematuria,  rash, arthralgias, visual complaints, headache, numbness weakness or ataxia or problems with walking or  coordination,  change in mood/affect or memory.                      Objective:   Physical Exam  12/13/2013       214  Wt Readings from Last 3 Encounters:  11/14/13 214 lb 3.2 oz (97.16 kg)  11/14/13 211 lb 12.8 oz (96.072 kg)     amb wf nad  HEENT: nl dentition, turbinates, and orophanx. Nl external ear canals without cough reflex   NECK :  without JVD/Nodes/TM/ nl carotid upstrokes bilaterally   LUNGS: no acc muscle use, Ramirez to A and P bilaterally without cough on insp or exp maneuvers   CV:  RRR  no s3 or murmur or increase in P2, no edema   ABD:  soft and nontender with nl excursion in the supine position. No bruits or organomegaly, bowel sounds nl  MS:  warm without deformities, calf tenderness, cyanosis or clubbing  SKIN: warm and dry without lesions    NEURO:  alert, approp, no deficits    CXR  11/14/2013 :   The heart size and mediastinal contours are within normal limits.  Both lungs are Ramirez. The visualized skeletal structures are  unremarkable.      Assessment & Plan:

## 2013-12-13 NOTE — Progress Notes (Signed)
Quick Note:  Spoke with pt and notified of results per Dr. Wert. Pt verbalized understanding and denied any questions.  ______ 

## 2013-12-13 NOTE — Assessment & Plan Note (Signed)
11/14/2013  Walked RA x 3 laps @ 185 ft each stopped due to  End of study, sat 88, minimal symptoms of sob/ lightheaded, recovered quickly - w/u in [place] 1st week of Aug 2015  - PFT's 11/06/13 nl pfts  - echo 11/06/13 [place]  Nl LV, nl PA pressure, bubble study not done> 11/26/2013 rec repeat with bubble study to r/o R to L shunt  > negative   Discussed in detail all the  indications, usual  risks and alternatives  relative to the benefits with patient who agrees to proceed with empirical xarelto due to persistent concern of small "asymptomatic" pe resulting in desats and high risk they will recur given her bimonthly 12 trips and wt issues   See instructions for specific recommendations which were reviewed directly with the patient who was given a copy with highlighter outlining the key components.

## 2013-12-13 NOTE — Patient Instructions (Addendum)
Xarelto 15 mg twice daily x 3 weeks with food  and then 20 mg daily indefinitely  -  Hold for 2 doses  before procedures   Inderal 80 mg one daily   You will need your discharge summary and VQ and pfts results on return in 4 weeks

## 2013-12-13 NOTE — Assessment & Plan Note (Signed)
See Echo 12/11/13 - Normal LV function; grade 1 diastolic dysfunction; mild MR; negative saline microcavitation study. rx inderal 80 mg qd as also having palpitations

## 2013-12-20 ENCOUNTER — Telehealth: Payer: Self-pay | Admitting: Internal Medicine

## 2013-12-20 NOTE — Telephone Encounter (Signed)
Called [phone] Pt ID #  [id number]  They have approved the 15 mg  BID x 3 weeks and the 20 mg daily until 12/20/2014.  This has been back dated to 11-20-13.  i have called the pharmacy to make them aware.   They stated that the pt had a coupon so her first fill was free.  Nothing further is needed.

## 2014-01-02 ENCOUNTER — Other Ambulatory Visit: Payer: Self-pay | Admitting: Internal Medicine

## 2014-01-02 MED ORDER — INDERAL LA 80 MG PO CP24: 80.0000 mg | ORAL_CAPSULE | Freq: Every day | ORAL | Status: DC

## 2014-01-03 ENCOUNTER — Telehealth: Payer: Self-pay | Admitting: Internal Medicine

## 2014-01-03 ENCOUNTER — Telehealth: Payer: Self-pay

## 2014-01-03 MED ORDER — RIVAROXABAN 20 MG PO TABS: 20.0000 mg | ORAL_TABLET | Freq: Every day | ORAL | Status: DC

## 2014-01-03 NOTE — Telephone Encounter (Signed)
LMOM for pt to notify her, per Dr Cleta Albertsaub, he has received her med records and has reviewed them. He will be glad to see her to discuss them if she wished. Asked for CB w/any ?s.

## 2014-01-03 NOTE — Telephone Encounter (Signed)
Called and spoke to pharm. Pt received call from [facility] stating MW will need to approve Inderall before pt receives the medication through mail order. Called and spoke to [place] with [facility]. Larita FifeLynn stated the form has been faxed for MW to complete and fax back to [facility] for pt to begin mail order for Inderall. Unsure if Verlon AuLeslie has received this form, I ask Larita FifeLynn to go ahead and fax the form again to be sure we receive it. Larita FifeLynn verbalized understanding and will fax form. Larita FifeLynn stated the pt also has a balance issue and will need to call [facility] to have the issue resolved before she can receive the Inderall via mail.   Called and spoke to pt. Informed pt of the above. Pt stated she will call [facility] to get the balance issue resolved. Pt stated she also needs Xarelto 20mg  send to [facility]. Xarelto was already sent to local pharm but they were unable to fill it d/t two different prescriptions of Xarelto being sent in (MW wanted pt on 15mg  BID x 3 weeks then 20mg  qd indefinitely). Xarelto 20mg  sent to [facility]. Pt aware. Pt stated she does not need new rx of inderall because of already have over a week supply as of now.   Will forward to [place] to look out for form to have MW complete.

## 2014-01-07 NOTE — Telephone Encounter (Signed)
Form from [facility] received, signed by MW and faxed back to [facility]

## 2014-01-10 ENCOUNTER — Telehealth: Payer: Self-pay | Admitting: Internal Medicine

## 2014-01-10 NOTE — Telephone Encounter (Signed)
Called spoke with [facility]. Gave VO for generic inderal. Nothing further needed

## 2014-01-15 ENCOUNTER — Encounter (INDEPENDENT_AMBULATORY_CARE_PROVIDER_SITE_OTHER): Payer: Self-pay

## 2014-01-15 ENCOUNTER — Ambulatory Visit (INDEPENDENT_AMBULATORY_CARE_PROVIDER_SITE_OTHER): Payer: Medicare Other | Admitting: Internal Medicine

## 2014-01-15 ENCOUNTER — Encounter: Payer: Self-pay | Admitting: Internal Medicine

## 2014-01-15 VITALS — BP 122/82 | HR 74 | Temp 98.6°F | Ht 65.0 in | Wt 215.8 lb

## 2014-01-15 DIAGNOSIS — R0902 Hypoxemia: Secondary | ICD-10-CM

## 2014-01-15 DIAGNOSIS — I82409 Acute embolism and thrombosis of unspecified deep veins of unspecified lower extremity: Secondary | ICD-10-CM

## 2014-01-15 MED ORDER — APIXABAN 5 MG PO TABS
5.0000 mg | ORAL_TABLET | Freq: Two times a day (BID) | ORAL | Status: DC
Start: 2014-01-15 — End: 2014-07-10

## 2014-01-15 NOTE — Progress Notes (Signed)
Subjective:    Patient ID: Melinda Ramirez, female    DOB: [birth year]  MRN: [id number]  HPI  [id number] yowf never smoker / [employer] in environmental science on prn lovenox per [facility] with hx 1st week of Aug 3rd 2015 02 dropped / pulse up / dizzy with hbp > [facility] rx with 02 and nl v/q and bubble study all reported nl and d/c off 02, not change rx,  Felt better but not very active then am 11/14/2013 felt same sensation with low 02 sats with nl D dimer at Dr Ellis Parentsaub's office so sent to pulmonary clinic 11/14/2013   Last simlar episode June 2015 recurred  over sev days and resolved on its own.  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 Developed right leg swelling and pain 17 days after an arthroscopic right ACL repair. ii. Bilateral leg ultrasound performed at [facility] showed the presence of DVT from the right mid-femoral to the posterior tibial vein.  (1) V/Q scan reportedly interpreted as "negative" for PE iii. Was admitted, started on enoxaparin 05/28/09 and then warfarin 05/29/09.  iv. Due to her clot burden and travel requirements, was recommended to undergo thrombolysis/thrombectomy.  v. Was transferred [facility] for [facility] and treatment.  vi. Ultrasound showed the presence of an occlusive thrombus in the right popliteal vein, without thrombosis in the left leg, 06/01/09 vi. MRV showed compression of the left common iliac vein with proximal focal dilatation and compression of the right common iliac vein by the uterus and a right 2.1 cm adnexal cyst.  vii. Bilateral ultrasound performed 06/04/09 (1) Right leg showed the presence of deep vein thrombosis in the right mid-/distal femoral, popliteal, posterior  tibial, and peroneal veins (2) Left leg showed no thrombosis.  viii. Limited thrombophilia evaluation unrevealing: (1) No evidence of activated protein C resistance.  (2) Antithrombin activity 99% (3) No evidence of a lupus anticoagulant or anticardiolipin antibodies ix. Due to persistent symptoms while on therapeutic warfarin, decision made to pursue placement of an IVC filter, popliteal venogram with thrombectomy and thrombolysis.  x. Bilateral leg ultrasound performed, 06/26/09 (1) Right leg showed incompetent deep venous system without an acute DVT and chronic, recanalized popliteal DVT.  (2) Left leg showed a normal study xi. Developed persistent abdominal pain without any overt findings on repeated CT scans of the abdomen, felt that perhaps misalignment of the IVC filter might be contributing to her symptoms of severe pain and radiculopathy. (1) Noted that the prongs of the filter were penetrating the IVC wall and were in close proximity to the lumbar vertebrae, without evidence of hemorrhage or erosion.  xii. Underwent removal of the IVC filter with subsequent chronic radicular pain, 06/28/09 xiii. Right lower extremity ultrasound showed evidence of chronic deep venous thrombosis only in one of the duplicated popliteal veins with chronic appearing nonocclusive thrombus with linear septation and incomplete color fill and compression. Otherwise the deep and superficial venous systems including calf veins were patent without evidence of thrombosis, 11/03/2009 xiv. Subsequent venogram showed no stenosis leading to discontinuation of warfarin by Vascular Surgery, 03/2010. xv. Ultrasound was negative for DVT in the left leg 10/17/2010. xvi. Repeat bilateral lower extremity ultrasound showed no evidence of venous thrombosis, 02/08/2011. xvii. Evaluated in  the Hematology Clinic, where it was felt that based upon her history, physical examination, and the available laboratory data, there was no reason  to warrant resuming therapeutic anticoagulation, 03/24/11 (1) Recommend thromboprophylaxis in settings of increased thrombotic risk.  xviii. MRV showed no evidence for acute or chronic deep venous thrombosis in the abdomen, pelvis, and bilateral lower extremities, 06/05/11. (1) Incidental note made of right greater than left prominent superficial veins in the proximal calves and distal thighs without evidence for central venous stenosis. b. 2nd episode, 01/22/12 i. Developed cough and dyspnea the day following the RFA of her left GSV. ii. Presented to [facility] and noted to have a room air pulse oximetry of 95% and referred to the nearest ER.  iii. Patient presented to the [facility] ER and noted to have a very low probability V/Q scan.  iv. Ultrasound of the left leg showed evidence of acute obstruction in the left common femoral vein, with left GSV thrombus noted after ablation.  v. Was started on unfractionated heparin, transitioned to enoxaparin, and then bridged to warfarin as an out-patient. vi. Follow-up ultrasound of the left leg showed no evidence of acute or chronic deep or superficial vein thrombosis, with the segment of the great saphenous vein which was  treated being occluded, 02/14/12 vii. Warfarin stopped 03/08/12      11/14/2013 1st [facility] office visit/ [name]  Chief Complaint  Patient presents with  . Pulmonary Consult    Referred by Dr. Lesle Chris.  Pt has hx of PE and DVT- fall 2013.  She states that she has occ hypoxia.    on macrodantin prn post coital and last took it July 16  Walked an hour one day prior to OV  s difficulty but did not check sats Prev when blood clot [facility] based on clot in leg and low 02 level but no def finding on V/Q rec It is unlikely that you now have a blood clot but you have a high risk of having another one and we have no other explanation for your recurrent clots and to prevent them you should be on xarelto indefinitely  and hope that you will reconsider and see your hematology doctors at [facility] in concert with a pulmonary opinion there as well (we could set this up after I have a chance to review your [place] records)  We will request your echo report/ VQ results and cxr reports/ your PFT's from [place] I would avoid further macrodantin indefinitely  Work on wt loss by paced exercise    12/13/2013 f/u ov/Lydie Stammen re: unexplained hypoxemia  Chief Complaint  Patient presents with  . Follow-up    Pt here to f/u on recent tests. She denies any new co's today.   doing better with paced exercise but still occ desats  Still driving 2 x monthly to [place] and wants lovenox but really would prefer xarelto (see a/p).   rec Xarelto 15 mg twice daily x 3 weeks with food  and then 20 mg daily indefinitely  -  Hold for 2 doses  before procedures  Inderal 80 mg one daily  You will need your discharge summary and VQ and pfts results on return in 4 weeks     01/15/2014 f/u ov/Broderick Fonseca re:  Chief Complaint  Patient presents with  . Follow-up    reaction to Xarelto (blisters on legs) had to stop taking it.  Xarelto helped with breathing, now that she has stopped it again,  her breathing has gotten worse       No obvious day to day or daytime variabilty or assoc chronic cough or cp or chest tightness, subjective wheeze overt sinus or hb symptoms. No unusual exp hx or h/o childhood pna/ asthma or knowledge of premature birth.  Sleeping ok without nocturnal  or early am exacerbation  of respiratory  c/o's or need for noct saba. Also denies any obvious fluctuation of symptoms with weather or environmental changes or other aggravating or alleviating factors except as outlined above   Current Medications, Allergies, Complete Past Medical History, Past Surgical History, Family History, and Social History were reviewed in [facility] electronic medical record.  ROS  The following are not active complaints unless bolded sore throat, dysphagia,  dental problems, itching, sneezing,  nasal congestion or excess/ purulent secretions, ear ache,   fever, chills, sweats, unintended wt loss, pleuritic or exertional cp, hemoptysis,  orthopnea pnd or leg swelling, presyncope, palpitations, heartburn, abdominal pain, anorexia, nausea, vomiting, diarrhea  or change in bowel or urinary habits, change in stools or urine, dysuria,hematuria,  rash, arthralgias, visual complaints, headache, numbness weakness or ataxia or problems with walking or coordination,  change in mood/affect or memory.                      Objective:   Physical Exam  12/13/2013       214  > 01/15/2014  216  Wt Readings from Last 3 Encounters:  11/14/13 214 lb 3.2 oz (97.16 kg)  11/14/13 211 lb 12.8 oz (96.072 kg)     amb wf nad  HEENT: nl dentition, turbinates, and orophanx. Nl external ear canals without cough reflex   NECK :  without JVD/Nodes/TM/ nl carotid upstrokes bilaterally   LUNGS: no acc muscle use, clear to A and P bilaterally without cough on insp or exp maneuvers   CV:  RRR  no s3 or murmur or increase in P2, no edema   ABD:  soft and nontender with nl excursion in the supine position. No bruits or organomegaly, bowel sounds nl  MS:  warm without deformities, calf tenderness, cyanosis or clubbing  SKIN: warm and dry without lesions    NEURO:  alert, approp, no deficits    CXR  11/14/2013 :   The heart size and mediastinal contours are within normal limits.  Both lungs are clear. The visualized skeletal structures are  unremarkable.      Assessment & Plan:

## 2014-01-15 NOTE — Patient Instructions (Addendum)
Elaquis 5 mg twice daily and if not able to tolerate I would strongly encourage an evaluation by [facility]   Please schedule a follow up visit in 3 months but call sooner if needed

## 2014-01-16 ENCOUNTER — Ambulatory Visit (INDEPENDENT_AMBULATORY_CARE_PROVIDER_SITE_OTHER): Payer: 59 | Admitting: Emergency Medicine

## 2014-01-16 VITALS — BP 100/80 | HR 71 | Temp 98.1°F | Resp 16 | Ht 65.0 in | Wt 215.2 lb

## 2014-01-16 DIAGNOSIS — G4700 Insomnia, unspecified: Secondary | ICD-10-CM

## 2014-01-16 DIAGNOSIS — E039 Hypothyroidism, unspecified: Secondary | ICD-10-CM

## 2014-01-16 DIAGNOSIS — Z658 Other specified problems related to psychosocial circumstances: Secondary | ICD-10-CM

## 2014-01-16 DIAGNOSIS — F439 Reaction to severe stress, unspecified: Secondary | ICD-10-CM

## 2014-01-16 DIAGNOSIS — M797 Fibromyalgia: Secondary | ICD-10-CM

## 2014-01-16 LAB — TSH: TSH: 0.372 u[IU]/mL (ref 0.350–4.500)

## 2014-01-16 LAB — T4, FREE: Free T4: 0.8 ng/dL (ref 0.80–1.80)

## 2014-01-16 MED ORDER — HYDROXYZINE PAMOATE 50 MG PO CAPS: 50.0000 mg | ORAL_CAPSULE | ORAL | Status: DC | PRN

## 2014-01-16 MED ORDER — CARBAMAZEPINE 200 MG PO TABS: 200.0000 mg | ORAL_TABLET | Freq: Two times a day (BID) | ORAL | Status: DC

## 2014-01-16 MED ORDER — THYROID 60 MG PO TABS: 60.0000 mg | ORAL_TABLET | Freq: Three times a day (TID) | ORAL | Status: DC

## 2014-01-16 MED ORDER — LORAZEPAM 2 MG PO TABS: 2.0000 mg | ORAL_TABLET | ORAL | Status: DC | PRN

## 2014-01-16 MED ORDER — ZOLPIDEM TARTRATE 5 MG PO TABS: 5.0000 mg | ORAL_TABLET | Freq: Every day | ORAL | Status: DC

## 2014-01-16 NOTE — Progress Notes (Signed)
Subjective:    Patient ID: Melinda Ramirez, female    DOB: [birth year], 48 y.o.   MRN: [id number]  This chart was scribed for Lesle ChrisSteven Jovanni Eckhart, MD by Tonye RoyaltyJoshua Chen, ED Scribe. This patient was seen in room 12 and the patient's care was started at 9:15 AM.   HPI HPI Comments: Melinda Ramirez is a 48 y.o. female who presents [facility] the [facility] for follow up and medication refill. When asked about how she has been, she states that she has been "variable." She states she has seen the pulmonologist 3 times and was started an Xarelto to which she had an allergic reaction with blisters, so he switched her to Eliquis. She states she also recently started Coumadin again. She states [place] from [place] 29 until January 20; she expresses concern about managing her medication during that time. She states she will have Lovenox shots. She reports difficulty getting to her sleep specialist neurologist because he has decided to focus on pediatric patients, so she requests medication for sleep medication: Zolpidem 10mg , Lorazepam, Hydroxyzine, Carbamazepine, and Tegretol. She states she also uses Armour Thyroid and Provigil for long distance driving. She states she has had a flu shot.    Medication List       This list is accurate as of: 01/16/14  9:22 AM.  Always use your most recent med list.               apixaban 5 MG Tabs tablet  Commonly known as:  ELIQUIS  Take 1 tablet (5 mg total) by mouth 2 (two) times daily.     carbamazepine 200 MG tablet  Commonly known as:  TEGRETOL  Take 200 mg by mouth 2 (two) times daily.     carisoprodol 350 MG tablet  Commonly known as:  SOMA  Take 350 mg by mouth 3 (three) times daily.     diphenhydrAMINE 50 MG tablet  Commonly known as:  BENADRYL  Take 50 mg by mouth every 6 (six) hours as needed for itching.     docusate sodium 50 MG capsule  Commonly known as:  COLACE  Take 50 mg by mouth once.     EPINEPHrine 0.3  mg/0.3 mL Soaj injection  Commonly known as:  EPI-PEN  Inject 0.3 mg into the muscle once.     hydrOXYzine 50 MG capsule  Commonly known as:  VISTARIL  Take 50 mg by mouth as needed.     INDERAL LA 80 MG 24 hr capsule  Generic drug:  propranolol ER  Take 1 capsule (80 mg total) by mouth daily.     LORazepam 2 MG tablet  Commonly known as:  ATIVAN  Take 2 mg by mouth as needed for anxiety.     modafinil 200 MG tablet  Commonly known as:  PROVIGIL  Take 200 mg by mouth as needed.     morphine 15 MG 12 hr tablet  Commonly known as:  MS CONTIN  Take 15 mg by mouth every 8 (eight) hours.     oxyCODONE 5 MG immediate release tablet  Commonly known as:  Oxy IR/ROXICODONE  Take 5 mg by mouth every 6 (six) hours as needed for severe pain.     thyroid 60 MG tablet  Commonly known as:  ARMOUR  Take 60 mg by mouth 3 (three) times daily.     VENTOLIN HFA IN  Inhale into the lungs as needed.  zolpidem 10 MG tablet  Commonly known as:  AMBIEN  Take 10 mg by mouth daily.       Review of Systems  Hematological: Bruises/bleeds easily.  Psychiatric/Behavioral: Positive for sleep disturbance.      Objective:   Physical Exam CONSTITUTIONAL: Well developed/well nourished, alert, cooperative HEAD: Normocephalic/atraumatic EYES: EOMI/PERRL ENMT: Mucous membranes moist NECK: supple no meningeal signs SPINE:entire spine nontender CV: regular rate and rhythm, S1/S2 noted, no murmurs/rubs/gallops noted LUNGS: Lungs are clear to auscultation bilaterally, no apparent distress ABDOMEN: soft, nontender, no rebound or guarding GU:no cva tenderness NEURO: Pt is awake/alert, moves all extremitiesx4 EXTREMITIES: pulses normal, full ROM SKIN: warm, color normal PSYCH: no abnormalities of mood noted No orders of the defined types were placed in this encounter.    Results for orders placed in visit on 11/14/13  BRAIN NATRIURETIC PEPTIDE      Result Value Ref Range   Pro B Natriuretic  peptide (BNP) 15.0  0.0 - 100.0 pg/mL  BASIC METABOLIC PANEL      Result Value Ref Range   Sodium 133 (*) 135 - 145 mEq/L   Potassium 4.1  3.5 - 5.1 mEq/L   Chloride 99  96 - 112 mEq/L   CO2 25  19 - 32 mEq/L   Glucose, Bld 105 (*) 70 - 99 mg/dL   BUN 10  6 - 23 mg/dL   Creatinine, Ser 0.7  0.4 - 1.2 mg/dL   Calcium 9.4  8.4 - 16.110.5 mg/dL   GFR 096.04100.61  >54.09>60.00 mL/min  TSH      Result Value Ref Range   TSH 0.20 (*) 0.35 - 4.50 uIU/mL      Assessment & Plan:   I did repeat her TSH and T4 levels. Her last TSH was low and she may need a dosage adjustment on her Armour Thyroid. I declined to refill her Provigil. I told her the highest dose of Ambien I was comfortable with was 5 mg. I did tell her Ativan 2 mg at bedtime was an alternative. She is scheduled to go [place] for the next 2 months we'll see her on return. I did refer her to neurology to help with her sleep disorder

## 2014-01-17 ENCOUNTER — Encounter: Payer: Self-pay | Admitting: Internal Medicine

## 2014-01-17 NOTE — Assessment & Plan Note (Signed)
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 Developed right leg swelling and pain 17 days after an arthroscopic right ACL repair. ii. Bilateral leg ultrasound performed at [facility] showed the presence of DVT from the right mid-femoral to the posterior tibial vein.  (1) V/Q scan reportedly interpreted as "negative" for PE iii. Was admitted, started on enoxaparin 05/28/09 and then warfarin 05/29/09.  iv. Due to her clot burden and travel requirements, was recommended to undergo thrombolysis/thrombectomy.  v. Was transferred to [facility] for further evaluation and treatment.  vi. Ultrasound showed the presence of an occlusive thrombus in the right popliteal vein, without thrombosis in the left leg, 06/01/09 vi. MRV showed compression of the left common iliac vein with proximal focal dilatation and compression of the right common iliac vein by the uterus and a right 2.1 cm adnexal cyst.  vii. Bilateral ultrasound performed 06/04/09 (1) Right leg showed the presence of deep vein thrombosis in the right mid-/distal femoral, popliteal, posterior tibial, and peroneal veins (2) Left leg showed no thrombosis.  viii. Limited thrombophilia evaluation unrevealing: (1) No evidence of activated protein C resistance.  (2) Antithrombin activity 99% (3) No evidence of a lupus anticoagulant or anticardiolipin antibodies ix. Due to persistent symptoms while on therapeutic warfarin, decision made to pursue placement of an IVC filter, popliteal venogram with thrombectomy and thrombolysis.  x. Bilateral leg ultrasound performed, 06/26/09 (1) Right leg showed incompetent deep venous system without an acute DVT and chronic, recanalized popliteal DVT.  (2) Left leg showed a normal study xi. Developed persistent abdominal pain without any overt findings on repeated CT scans of the abdomen, felt that perhaps misalignment of the IVC filter might be contributing to her symptoms of severe pain and  radiculopathy. (1) Noted that the prongs of the filter were penetrating the IVC wall and were in close proximity to the lumbar vertebrae, without evidence of hemorrhage or erosion.  xii. Underwent removal of the IVC filter with subsequent chronic radicular pain, 06/28/09 xiii. Right lower extremity ultrasound showed evidence of chronic deep venous thrombosis only in one of the duplicated popliteal veins with chronic appearing nonocclusive thrombus with linear septation and incomplete color fill and compression. Otherwise the deep and superficial venous systems including calf veins were patent without evidence of thrombosis, 11/03/2009 xiv. Subsequent venogram showed no stenosis leading to discontinuation of warfarin by Vascular Surgery, 03/2010. xv. Ultrasound was negative for DVT in the left leg 10/17/2010. xvi. Repeat bilateral lower extremity ultrasound showed no evidence of venous thrombosis, 02/08/2011. xvii. Evaluated in the Hematology Clinic, where it was felt that based upon her history, physical examination, and the available laboratory data, there was no reason to warrant resuming therapeutic anticoagulation, 03/24/11 (1) Recommend thromboprophylaxis in settings of increased thrombotic risk.  xviii. MRV showed no evidence for acute or chronic deep venous thrombosis in the abdomen, pelvis, and bilateral lower extremities, 06/05/11. (1) Incidental note made of right greater than left prominent superficial veins in the proximal calves and distal thighs without evidence for central venous stenosis. b. 2nd episode, 01/22/12 i. Developed cough and dyspnea the day following the RFA of her left GSV. ii. Presented to [facility] and noted to have a room air pulse oximetry of 95% and referred to the nearest ER.  iii. Patient presented [facility] and noted to have a very low probability V/Q scan.  iv. Ultrasound of the left leg showed evidence of acute obstruction in the left common femoral  vein, with left  GSV thrombus noted after ablation.  v. Was started on unfractionated heparin, transitioned to enoxaparin, and then bridged to warfarin as an out-patient. vi. Follow-up ultrasound of the left leg showed no evidence of acute or chronic deep or superficial vein thrombosis, with the segment of the great saphenous vein which was  treated being occluded, 02/14/12 vii. Warfarin stopped 03/08/12  Lab Results  Component Value Date   DDIMER 0.27 11/14/2013    12/13/2013  Started xarelto >changed to eliquis 01/15/14 due to ? Reaction to xarelto

## 2014-01-17 NOTE — Assessment & Plan Note (Addendum)
11/14/2013  Walked RA x 3 laps @ 185 ft each stopped due to  End of study, sat 88, minimal symptoms of sob/ lightheaded, recovered quickly - w/u [place] 1st week of Aug 2015  - PFT's 11/06/13 nl pfts  - echo 11/06/13 [place]  Nl LV, nl PA pressure, bubble study not done> 11/26/2013 rec repeat with bubble study to r/o R to L shunt  > negative  - rx xarelto 12/13/2013 > changed to eliquis 01/15/14 due to ? Drug reaction   I had an extended discussion with the patient today lasting 15 to 20 minutes of a 25 minute visit on the following issues:  If can't take eliquis 5 mg bid needs to be referred to [facility] as was told by [facility] heme she did not need anticoagulation and based on my risk/benefit analysis previous visits she needs to be on anticoagulation indefinitely as her unexplained tendency to desats suggests to me she either has ongoing low grade clots at worst or poor reserve from prior clots/ infarcts (despite absence of PAH) at best and would be at high risk pulmonary complications from future events.

## 2014-01-24 ENCOUNTER — Encounter: Payer: Self-pay | Admitting: Emergency Medicine

## 2014-07-10 ENCOUNTER — Ambulatory Visit (INDEPENDENT_AMBULATORY_CARE_PROVIDER_SITE_OTHER): Payer: Medicare Other | Admitting: Emergency Medicine

## 2014-07-10 VITALS — BP 114/82 | HR 102 | Temp 98.4°F | Resp 16 | Ht 65.0 in | Wt 212.0 lb

## 2014-07-10 DIAGNOSIS — E039 Hypothyroidism, unspecified: Secondary | ICD-10-CM | POA: Diagnosis not present

## 2014-07-10 DIAGNOSIS — F439 Reaction to severe stress, unspecified: Secondary | ICD-10-CM

## 2014-07-10 DIAGNOSIS — Z658 Other specified problems related to psychosocial circumstances: Secondary | ICD-10-CM

## 2014-07-10 DIAGNOSIS — G4700 Insomnia, unspecified: Secondary | ICD-10-CM | POA: Diagnosis not present

## 2014-07-10 DIAGNOSIS — M797 Fibromyalgia: Secondary | ICD-10-CM

## 2014-07-10 DIAGNOSIS — I82409 Acute embolism and thrombosis of unspecified deep veins of unspecified lower extremity: Secondary | ICD-10-CM | POA: Diagnosis not present

## 2014-07-10 DIAGNOSIS — Z7189 Other specified counseling: Secondary | ICD-10-CM

## 2014-07-10 LAB — CBC
HEMATOCRIT: 42.9 % (ref 36.0–46.0)
Hemoglobin: 14.5 g/dL (ref 12.0–15.0)
MCH: 29.8 pg (ref 26.0–34.0)
MCHC: 33.8 g/dL (ref 30.0–36.0)
MCV: 88.1 fL (ref 78.0–100.0)
MPV: 9 fL (ref 8.6–12.4)
Platelets: 233 10*3/uL (ref 150–400)
RBC: 4.87 MIL/uL (ref 3.87–5.11)
RDW: 13.1 % (ref 11.5–15.5)
WBC: 6.6 10*3/uL (ref 4.0–10.5)

## 2014-07-10 LAB — COMPREHENSIVE METABOLIC PANEL
ALBUMIN: 4.3 g/dL (ref 3.5–5.2)
ALT: 13 U/L (ref 0–35)
AST: 15 U/L (ref 0–37)
Alkaline Phosphatase: 42 U/L (ref 39–117)
BUN: 17 mg/dL (ref 6–23)
CALCIUM: 9.6 mg/dL (ref 8.4–10.5)
CHLORIDE: 107 meq/L (ref 96–112)
CO2: 22 meq/L (ref 19–32)
Creat: 0.67 mg/dL (ref 0.50–1.10)
GLUCOSE: 111 mg/dL — AB (ref 70–99)
POTASSIUM: 4.9 meq/L (ref 3.5–5.3)
Sodium: 139 mEq/L (ref 135–145)
Total Bilirubin: 0.2 mg/dL (ref 0.2–1.2)
Total Protein: 7.2 g/dL (ref 6.0–8.3)

## 2014-07-10 LAB — PROTIME-INR
INR: 0.96 (ref <1.50)
Prothrombin Time: 12.8 seconds (ref 11.6–15.2)

## 2014-07-10 LAB — TSH: TSH: 0.224 u[IU]/mL — AB (ref 0.350–4.500)

## 2014-07-10 MED ORDER — WARFARIN SODIUM 5 MG PO TABS: ORAL_TABLET | ORAL | Status: DC

## 2014-07-10 MED ORDER — CARBAMAZEPINE 200 MG PO TABS: 200.0000 mg | ORAL_TABLET | Freq: Two times a day (BID) | ORAL | Status: DC

## 2014-07-10 MED ORDER — LORAZEPAM 2 MG PO TABS: 2.0000 mg | ORAL_TABLET | ORAL | Status: DC | PRN

## 2014-07-10 MED ORDER — EPINEPHRINE 0.3 MG/0.3ML IJ SOAJ: 0.3000 mg | Freq: Once | INTRAMUSCULAR | Status: AC

## 2014-07-10 MED ORDER — ENOXAPARIN SODIUM 100 MG/ML ~~LOC~~ SOLN: 100.0000 mg | Freq: Two times a day (BID) | SUBCUTANEOUS | Status: DC

## 2014-07-10 MED ORDER — HYDROXYZINE PAMOATE 50 MG PO CAPS
50.0000 mg | ORAL_CAPSULE | ORAL | Status: AC | PRN
Start: 2014-07-10 — End: ?

## 2014-07-10 MED ORDER — ZALEPLON 10 MG PO CAPS: 10.0000 mg | ORAL_CAPSULE | Freq: Every evening | ORAL | Status: DC | PRN

## 2014-07-10 NOTE — Patient Instructions (Signed)
You have an appointment with Dr. Jeanine LuzGregory Calone on Friday at 9:45 He is located on [address] across from [facility] in the [facility] building Take your lovenox every 12 hours and start your coumadin today We will notify you of your lab results and appointment with Dr. Vickey Hugerohmeier   Enoxaparin injection What is this medicine? ENOXAPARIN (ee nox a PA rin) is used after knee, hip, or abdominal surgeries to prevent blood clotting. It is also used to treat existing blood clots in the lungs or in the veins. This medicine may be used for other purposes; ask your health care provider or pharmacist if you have questions. COMMON BRAND NAME(S): Lovenox What should I tell my health care provider before I take this medicine? They need to know if you have any of these conditions: -bleeding disorders, hemorrhage, or hemophilia -infection of the heart or heart valves -kidney or liver disease -previous stroke -prosthetic heart valve -recent surgery or delivery of a baby -ulcer in the stomach or intestine, diverticulitis, or other bowel disease -an unusual or allergic reaction to enoxaparin, heparin, pork or pork products, other medicines, foods, dyes, or preservatives -pregnant or trying to get pregnant -breast-feeding How should I use this medicine? This medicine is for injection under the skin. It is usually given by a health-care professional. You or a family member may be trained on how to give the injections. If you are to give yourself injections, make sure you understand how to use the syringe, measure the dose if necessary, and give the injection. To avoid bruising, do not rub the site where this medicine has been injected. Do not take your medicine more often than directed. Do not stop taking except on the advice of your doctor or health care professional. Make sure you receive a puncture-resistant container to dispose of the needles and syringes once you have finished with them. Do not reuse these  items. Return the container to your doctor or health care professional for proper disposal. Talk to your pediatrician regarding the use of this medicine in children. Special care may be needed. Overdosage: If you think you have taken too much of this medicine contact a poison control center or emergency room at once. NOTE: This medicine is only for you. Do not share this medicine with others. What if I miss a dose? If you miss a dose, take it as soon as you can. If it is almost time for your next dose, take only that dose. Do not take double or extra doses. What may interact with this medicine? -aspirin and aspirin-like medicines -certain medicines that treat or prevent blood clots -dipyridamole -NSAIDs, medicines for pain and inflammation, like ibuprofen or naproxen This list may not describe all possible interactions. Give your health care provider a list of all the medicines, herbs, non-prescription drugs, or dietary supplements you use. Also tell them if you smoke, drink alcohol, or use illegal drugs. Some items may interact with your medicine. What should I watch for while using this medicine? Visit your doctor or health care professional for regular checks on your progress. Your condition will be monitored carefully while you are receiving this medicine. Notify your doctor or health care professional and seek emergency treatment if you develop breathing problems; changes in vision; chest pain; severe, sudden headache; pain, swelling, warmth in the leg; trouble speaking; sudden numbness or weakness of the face, arm, or leg. These can be signs that your condition has gotten worse. If you are going to have surgery,  tell your doctor or health care professional that you are taking this medicine. Do not stop taking this medicine without first talking to your doctor. Be sure to refill your prescription before you run out of medicine. Avoid sports and activities that might cause injury while you are  using this medicine. Severe falls or injuries can cause unseen bleeding. Be careful when using sharp tools or knives. Consider using an Neurosurgeon. Take special care brushing or flossing your teeth. Report any injuries, bruising, or red spots on the skin to your doctor or health care professional. What side effects may I notice from receiving this medicine? Side effects that you should report to your doctor or health care professional as soon as possible: -allergic reactions like skin rash, itching or hives, swelling of the face, lips, or tongue -feeling faint or lightheaded, falls -signs and symptoms of bleeding such as bloody or black, tarry stools; red or dark-brown urine; spitting up blood or brown material that looks like coffee grounds; red spots on the skin; unusual bruising or bleeding from the eye, gums, or nose Side effects that usually do not require medical attention (report to your doctor or health care professional if they continue or are bothersome): -pain, redness, or irritation at site where injected This list may not describe all possible side effects. Call your doctor for medical advice about side effects. You may report side effects to FDA at 1-800-FDA-1088. Where should I keep my medicine? Keep out of the reach of children. Store at room temperature between 15 and 30 degrees C (59 and 86 degrees F). Do not freeze. If your injections have been specially prepared, you may need to store them in the refrigerator. Ask your pharmacist. Throw away any unused medicine after the expiration date. NOTE: This sheet is a summary. It may not cover all possible information. If you have questions about this medicine, talk to your doctor, pharmacist, or health care provider.  2015, Elsevier/Gold Standard. (2013-07-24 16:06:21)

## 2014-07-10 NOTE — Progress Notes (Signed)
Subjective:    Patient ID: Melinda Ramirez, female    DOB: [birth year], 49 y.o.   MRN: [id number]  HPI This is a very pleasant 49 yo patient of Dr. Ellis Parents. She presents today with a couple of concerns. She returned recently form a long trip that she states was "overarchingly good." She did have a bad ankle sprain and requires surgery which is planned for October. She was off Eliquis and restarted with resulting severe nausea and is currently off anticoagulation. Has been off Eliquis for at least 4 weeks. She is not sure if she has had worsening leg swelling because she has frequent swelling which is worse with recent ankle injury. Her husband and daughter live in [place] and the patient is back and forth. She is driving up today and flying back. She is open to lovenox injection today and is able to self inject. She had an episode of apoxia for about 4 days last week with pulse ox 80s. She did not seek care.    Her concerns today- Starting coumadin- was on 5 and needed 7.5 when she eats a lot of greens. She reports she was well controlled at that dose. She is planning on having her labs drawn in [place]. Insomnia- She reports that she needs a new referral. Is only sleeping 1.5-2 hours a night.  This is a long standing problem for her. She has difficulty falling asleep and staying asleep. She has seen neurology and had sleep study at [facility] many years ago which showed lack of deep sleep. No apnea diagnosed. Abnormal gaba receptors noted during study. She hears things and sees things while sleeping. She takes ambien 5 mg and ativan 2 mg with out good results. She would like to try Sonata for a month along with the ativan. She has used this before.  DVT/Oxygenation- she had episodes of hypoxia into the 80s last week, usually comes up to 92 with about a minute of hyperventilating. She feels this is due to microemboli. She uses her incentive spirometer multiple times throughout the day. When she is anticoagulated, she  does not have problems with hypoxia unless she has a reactive airway issue which is resolved with albuterol. Wears compression stockings throughout the day and elevates legs multiple times.   Was on inderal but had dizziness and stopped. Her blood pressure is well controlled unless she has uncontrolled pain. She is seen by pain management monthly for chronic back pain.   .Review of Systems No fever, no chills, no chest pain, no change in breathing, no cough    Objective:   Physical Exam  Constitutional: She is oriented to person, place, and time. She appears well-developed and well-nourished.  HENT:  Head: Normocephalic and atraumatic.  Eyes: Conjunctivae are normal.  Neck: Normal range of motion. Neck supple.  Cardiovascular: Normal rate, regular rhythm and normal heart sounds.   Pulmonary/Chest: Effort normal and breath sounds normal. No respiratory distress. She has no wheezes. She has no rales. She exhibits no tenderness.  Musculoskeletal: Normal range of motion. She exhibits tenderness (scattered areas of tendrness on bilateral calves and lower thighs. Patient reports these unchanged.). She exhibits no edema.  Neurological: She is alert and oriented to person, place, and time.  Skin: Skin is warm and dry.  Psychiatric: She has a normal mood and affect. Her behavior is normal. Judgment and thought content normal.  Vitals reviewed.  Pulse ox with ambulation 94-96. HR 96    Assessment & Plan:  1. DVT (  deep venous thrombosis), unspecified laterality - discussed importance of anticoagulation and strongly encouraged patient to stay in [place] until safely anticoagulated. Patient agreed and appointment made for patient to establish care with [facility] PCP so she can then be managed at the coumadin clinic. Patient given appointment with Dr. Carver Filaalone in 2 days (July 12, 2014 at 9:45) - enoxaparin (LOVENOX) 100 MG/ML injection; Inject 1 mL (100 mg total) into the skin every 12 (twelve) hours.   Dispense: 10 Syringe; Refill: 0 - warfarin (COUMADIN) 5 MG tablet; Take 1 tablet for two days then 1.5 tablets for 1 day  Dispense: 40 tablet; Refill: 3 - CBC - Comprehensive metabolic panel - Protime-INR  2. Insomnia - zaleplon (SONATA) 10 MG capsule; Take 1 capsule (10 mg total) by mouth at bedtime as needed for sleep.  Dispense: 30 capsule; Refill: 0 - LORazepam (ATIVAN) 2 MG tablet; Take 1 tablet (2 mg total) by mouth as needed for anxiety.  Dispense: 30 tablet; Refill: 3 - Ambulatory referral to Neurology  3. Fibromyalgia - carbamazepine (TEGRETOL) 200 MG tablet; Take 1 tablet (200 mg total) by mouth 2 (two) times daily.  Dispense: 60 tablet; Refill: 3  4. Stress - hydrOXYzine (VISTARIL) 50 MG capsule; Take 1 capsule (50 mg total) by mouth as needed.  Dispense: 30 capsule; Refill: 3  5. Hypothyroidism, unspecified hypothyroidism type - TSH  6. Encounter for anticoagulation discussion and counseling - Protime-INR   Emi Belfasteborah B. Gessner, FNP-BC  [facility], [facility]  07/10/2014 11:30 AM

## 2014-07-12 ENCOUNTER — Ambulatory Visit (INDEPENDENT_AMBULATORY_CARE_PROVIDER_SITE_OTHER): Payer: 59 | Admitting: Family

## 2014-07-12 ENCOUNTER — Telehealth: Payer: Self-pay | Admitting: Family

## 2014-07-12 ENCOUNTER — Encounter: Payer: Self-pay | Admitting: Family

## 2014-07-12 ENCOUNTER — Other Ambulatory Visit (INDEPENDENT_AMBULATORY_CARE_PROVIDER_SITE_OTHER): Payer: 59

## 2014-07-12 VITALS — BP 132/90 | HR 81 | Temp 98.2°F | Resp 18 | Ht 65.0 in | Wt 214.8 lb

## 2014-07-12 DIAGNOSIS — Z5181 Encounter for therapeutic drug level monitoring: Secondary | ICD-10-CM

## 2014-07-12 DIAGNOSIS — I82409 Acute embolism and thrombosis of unspecified deep veins of unspecified lower extremity: Secondary | ICD-10-CM | POA: Diagnosis not present

## 2014-07-12 LAB — PROTIME-INR
INR: 1.9 ratio — ABNORMAL HIGH (ref 0.8–1.0)
Prothrombin Time: 20.8 s — ABNORMAL HIGH (ref 9.6–13.1)

## 2014-07-12 NOTE — Telephone Encounter (Signed)
Please inform patient that her INR was 1.9, therefore no changes are necessary and please continue with the current plan that we discussed. Please have her recheck her A1c in 1 week when she gets to [place].

## 2014-07-12 NOTE — Assessment & Plan Note (Signed)
Previous history of multiple DVTs and PEs. Was tried on Xarelto and had a reaction to it. Placed back on coumadin. Restarted coumadin and is currently bridging with Lovenox. Obtain INR. Refer to coumadin clinic for management. Pending INR results, continue previous dosage of coumadin with M/T/TH/Sun - 5 mg and W/Sat - 7.5 mg. Recheck coumadin levels in 1 week while in [place].

## 2014-07-12 NOTE — Patient Instructions (Addendum)
Please continue to take your coumadin  Sunday, Monday, Tuesday, Thursday - 5 mg Wednesday and Saturday 7.5 mg  Please have your INR checked in 1 week.

## 2014-07-12 NOTE — Telephone Encounter (Signed)
Pt aware of results and will try to get her A1c checked while in [place] she said that if she can't she would have to get it done when she comes back.

## 2014-07-12 NOTE — Progress Notes (Signed)
Pre visit review using our clinic review tool, if applicable. No additional management support is needed unless otherwise documented below in the visit note. 

## 2014-07-12 NOTE — Progress Notes (Signed)
Subjective:    Patient ID: Melinda Ramirez, female    DOB: [birth year], 49 y.o.   MRN: [id number]  Chief Complaint  Patient presents with  . Establish Care    in a marriage where her husband lives in [place] and she lives here, she needs to keep track of coumadin in both places    HPI:  Melinda Ramirez is a 49 y.o. female who presents today to discuss her coumadin monitoring and establish with coumadin clinic.   1) Lovanox bridging to coumadin - She is taking coumadin for because of hypercoagulability and has had numerous DVTs and PEs. She was recently found to have low oxygen saturation when taken of anticoagulation. Starting taking coumadin 2 days ago with 10 mg. Previously on Sunday, Monday, Tuesday, Thursday 5 mg and then 7.5 mg on Wednesday and Saturday. She indicates that she does consume leafy grean vegetables. She has a commuter marriage and is [place] for 2 weeks and in [place] 2 [place].   Allergies  Allergen Reactions  . Bee Venom Anaphylaxis  . Amoxicillin   . Floxin [Ofloxacin]   . Gadolinium Derivatives Hives  . Hydromorphone Nausea And Vomiting  . Ibuprofen   . Iodinated Diagnostic Agents Swelling  . Iodine Hives  . Iohexol      Code: HIVES   . Prednisone Hives  . Tylenol [Acetaminophen] Hives  . Ultram [Tramadol] Hives  . Sulfur Rash  . Xarelto [Rivaroxaban] Rash    Current Outpatient Prescriptions on File Prior to Visit  Medication Sig Dispense Refill  . Albuterol Sulfate (VENTOLIN HFA IN) Inhale into the lungs as needed.    . carbamazepine (TEGRETOL) 200 MG tablet Take 1 tablet (200 mg total) by mouth 2 (two) times daily. 60 tablet 3  . carisoprodol (SOMA) 350 MG tablet Take 350 mg by mouth 3 (three) times daily.    . diphenhydrAMINE (BENADRYL) 50 MG tablet Take 50 mg by mouth every 6 (six) hours as needed for itching.    . docusate sodium (COLACE) 50 MG capsule Take 50 mg by mouth once.    . enoxaparin (LOVENOX) 100 MG/ML injection Inject  1 mL (100 mg total) into the skin every 12 (twelve) hours. 10 Syringe 0  . EPINEPHrine 0.3 mg/0.3 mL IJ SOAJ injection Inject 0.3 mLs (0.3 mg total) into the muscle once. 1 Device 1  . hydrOXYzine (VISTARIL) 50 MG capsule Take 1 capsule (50 mg total) by mouth as needed. 30 capsule 3  . LORazepam (ATIVAN) 2 MG tablet Take 1 tablet (2 mg total) by mouth as needed for anxiety. 30 tablet 3  . morphine (MS CONTIN) 15 MG 12 hr tablet Take 15 mg by mouth every 8 (eight) hours.    Marland Kitchen. oxyCODONE (OXY IR/ROXICODONE) 5 MG immediate release tablet Take 5 mg by mouth every 6 (six) hours as needed for severe pain.    Marland Kitchen. thyroid (ARMOUR) 60 MG tablet Take 1 tablet (60 mg total) by mouth 3 (three) times daily. 270 tablet 3  . warfarin (COUMADIN) 5 MG tablet Take 1 tablet for two days then 1.5 tablets for 1 day 40 tablet 3  . zaleplon (SONATA) 10 MG capsule Take 1 capsule (10 mg total) by mouth at bedtime as needed for sleep. 30 capsule 0  . zolpidem (AMBIEN) 5 MG tablet Take 1 tablet (5 mg total) by mouth at bedtime. 30 tablet 3   No current facility-administered medications on file prior to visit.  Past Medical History  Diagnosis Date  . Allergy   . Blood transfusion without reported diagnosis   . Clotting disorder   . Hypertension   . Thyroid disease     Past Surgical History  Procedure Laterality Date  . Appendectomy    . Joint replacement      Family History  Problem Relation Age of Onset  . Heart disease Mother   . Heart attack Maternal Grandfather   . Stroke Paternal Grandfather   . Heart disease Paternal Grandfather     History   Social History  . Marital Status: Married    Spouse Name: N/A  . Number of Children: N/A  . Years of Education: 18+   Occupational History  . Attorney    Social History Main Topics  . Smoking status: Never Smoker   . Smokeless tobacco: Never Used  . Alcohol Use: No  . Drug Use: No  . Sexual Activity: Not on file   Other Topics Concern  . Not on  file   Social History Narrative   Commuter marriage between [place] and [place].     Review of Systems  Respiratory: Negative for cough, chest tightness and shortness of breath.   Cardiovascular: Negative for chest pain, palpitations and leg swelling.  Hematological: Does not bruise/bleed easily.      Objective:    BP 132/90 mmHg  Pulse 81  Temp(Src) 98.2 F (36.8 C) (Oral)  Resp 18  Ht  (1.651 m)  Wt 214 lb 12.8 oz (97.433 kg)  BMI 35.74 kg/m2  SpO2 97%  LMP 07/03/2014 Nursing note and vital signs reviewed.  Physical Exam  Constitutional: She is oriented to person, place, and time. She appears well-developed and well-nourished. No distress.  Cardiovascular: Normal rate, regular rhythm, normal heart sounds and intact distal pulses.   Pulmonary/Chest: Effort normal and breath sounds normal.  Neurological: She is alert and oriented to person, place, and time.  Skin: Skin is warm and dry.  Psychiatric: She has a normal mood and affect. Her behavior is normal. Judgment and thought content normal.       Assessment & Plan:

## 2014-08-06 ENCOUNTER — Ambulatory Visit (INDEPENDENT_AMBULATORY_CARE_PROVIDER_SITE_OTHER): Payer: 59 | Admitting: General Practice

## 2014-08-06 ENCOUNTER — Other Ambulatory Visit: Payer: Self-pay | Admitting: Family Medicine

## 2014-08-06 ENCOUNTER — Other Ambulatory Visit: Payer: Self-pay | Admitting: General Practice

## 2014-08-06 DIAGNOSIS — I82409 Acute embolism and thrombosis of unspecified deep veins of unspecified lower extremity: Secondary | ICD-10-CM

## 2014-08-06 DIAGNOSIS — G4700 Insomnia, unspecified: Secondary | ICD-10-CM

## 2014-08-06 DIAGNOSIS — Z7901 Long term (current) use of anticoagulants: Secondary | ICD-10-CM

## 2014-08-06 LAB — POCT INR: INR: 1.5

## 2014-08-06 MED ORDER — WARFARIN SODIUM 5 MG PO TABS: ORAL_TABLET | ORAL | Status: AC

## 2014-08-06 MED ORDER — WARFARIN SODIUM 5 MG PO TABS: ORAL_TABLET | ORAL | Status: DC

## 2014-08-06 NOTE — Progress Notes (Signed)
Agree with plan 

## 2014-08-06 NOTE — Progress Notes (Signed)
Pre visit review using our clinic review tool, if applicable. No additional management support is needed unless otherwise documented below in the visit note. 

## 2014-09-03 ENCOUNTER — Ambulatory Visit: Payer: 59 | Admitting: Family Medicine

## 2014-09-03 ENCOUNTER — Ambulatory Visit: Payer: 59

## 2014-09-04 ENCOUNTER — Encounter: Payer: Self-pay | Admitting: Family Medicine

## 2014-09-04 ENCOUNTER — Ambulatory Visit (INDEPENDENT_AMBULATORY_CARE_PROVIDER_SITE_OTHER): Payer: 59 | Admitting: Family Medicine

## 2014-09-04 ENCOUNTER — Other Ambulatory Visit: Payer: Self-pay | Admitting: Family Medicine

## 2014-09-04 ENCOUNTER — Ambulatory Visit (INDEPENDENT_AMBULATORY_CARE_PROVIDER_SITE_OTHER): Payer: 59 | Admitting: *Deleted

## 2014-09-04 ENCOUNTER — Encounter: Payer: Self-pay | Admitting: Neurology

## 2014-09-04 ENCOUNTER — Ambulatory Visit (INDEPENDENT_AMBULATORY_CARE_PROVIDER_SITE_OTHER): Payer: 59 | Admitting: Neurology

## 2014-09-04 VITALS — BP 134/94 | HR 75 | Temp 98.0°F | Resp 16 | Ht 65.0 in | Wt 220.6 lb

## 2014-09-04 VITALS — BP 146/98 | HR 72 | Resp 16 | Ht 65.0 in | Wt 218.0 lb

## 2014-09-04 DIAGNOSIS — R351 Nocturia: Secondary | ICD-10-CM

## 2014-09-04 DIAGNOSIS — E669 Obesity, unspecified: Secondary | ICD-10-CM | POA: Diagnosis not present

## 2014-09-04 DIAGNOSIS — G479 Sleep disorder, unspecified: Secondary | ICD-10-CM

## 2014-09-04 DIAGNOSIS — R5383 Other fatigue: Secondary | ICD-10-CM | POA: Diagnosis not present

## 2014-09-04 DIAGNOSIS — I82409 Acute embolism and thrombosis of unspecified deep veins of unspecified lower extremity: Secondary | ICD-10-CM

## 2014-09-04 DIAGNOSIS — R739 Hyperglycemia, unspecified: Secondary | ICD-10-CM

## 2014-09-04 DIAGNOSIS — Z7901 Long term (current) use of anticoagulants: Secondary | ICD-10-CM

## 2014-09-04 DIAGNOSIS — R7309 Other abnormal glucose: Secondary | ICD-10-CM | POA: Diagnosis not present

## 2014-09-04 DIAGNOSIS — G478 Other sleep disorders: Secondary | ICD-10-CM | POA: Diagnosis not present

## 2014-09-04 DIAGNOSIS — Z6836 Body mass index (BMI) 36.0-36.9, adult: Secondary | ICD-10-CM

## 2014-09-04 DIAGNOSIS — R5382 Chronic fatigue, unspecified: Secondary | ICD-10-CM

## 2014-09-04 DIAGNOSIS — F1190 Opioid use, unspecified, uncomplicated: Secondary | ICD-10-CM | POA: Diagnosis not present

## 2014-09-04 DIAGNOSIS — G9332 Myalgic encephalomyelitis/chronic fatigue syndrome: Secondary | ICD-10-CM | POA: Insufficient documentation

## 2014-09-04 DIAGNOSIS — D8989 Other specified disorders involving the immune mechanism, not elsewhere classified: Secondary | ICD-10-CM | POA: Insufficient documentation

## 2014-09-04 LAB — POCT GLYCOSYLATED HEMOGLOBIN (HGB A1C): Hemoglobin A1C: 5.4

## 2014-09-04 LAB — POCT INR: INR: 2

## 2014-09-04 NOTE — Progress Notes (Signed)
Subjective:    Patient ID: Melinda Ramirez is a 49 y.o. female.  HPI     Huston Foley, MD, PhD [facility] [address] [address] [place], [place] [address]  Dear Melinda Ramirez,   I saw your patient, Melinda Ramirez, upon your kind request in my neurologic clinic today for initial consultation of her sleep disorder. The patient is unaccompanied today. As you know, Melinda Ramirez is a 49 year old right-handed woman with an underlying medical history of DVT, hypothyroidism, obesity, history of hypoxemia, seen by Dr. Sherene Sires in pulmonology, who reports difficulty with her sleep for as long as she can remember, even as a child. She reports difficulty initiating and maintaining sleep. She has gained weight in the last few years. She had a sleep study 5 or 6 years ago and was told that she did not have obstructive sleep apnea but did not sleep well and did not go into deep sleep. She denies any gasping sensations while asleep. She has not been told that she has pauses in her breathing. Her husband stays in [place] and she spends half the time with him and half the time alone. She is on chronic narcotic pain medication and managed by pain management in [place] for chronic back pain. She had a DVT in 2011 and had a [place] filter placed. She says the filter migrated. For about 10 years she saw neurologist but he moved to a different practice. She has been on multiple prescription medications for sleep. She had been alternating prescription medicines to not overuse one or the other. She has tried gabapentin, hydroxyzine, Ambien, Sonata, Ativan, and is currently on Sonata. Ambien was 10 mg strength and she took up to 20 mg at times. She denies restless leg symptoms. She has no overt morning headaches. She denies a family history of obstructive sleep apnea. She has never been a good sleeper. She has nocturia typically once per night. Since she started chronic pain medications she  stopped drinking alcohol. She used to drink socially. She drinks 1-2 cups of tea per day. She has never been a smoker. She is currently on morphine 3 times a day and Roxicodone 5 mg. In addition she also takes Tegretol and Benadryl as needed. She takes Vistaril as needed. She takes Ativan as needed. She takes propranolol. She is on Coumadin for DVT. She also has a prescription for Ambien 5 mg strength. She denies  depression. Her current bedtime is around 10 PM. Her rise time is between 6 and 7. She estimates that she gets only a few hours of sleep on an average night. Her Epworth sleepiness score is 4 out of 24 today, her fatigue score is 54 out of 63 today.  Her Past Medical History Is Significant For: Past Medical History  Diagnosis Date  . Allergy   . Blood transfusion without reported diagnosis   . Clotting disorder   . Hypertension   . Thyroid disease   . CFS (chronic fatigue syndrome)   . Spondylolysis   . Headache   . DVT (deep venous thrombosis)     Her Past Surgical History Is Significant For: Past Surgical History  Procedure Laterality Date  . Appendectomy    . Joint replacement    . Ivc filter placement       Her Family History Is Significant For: Family History  Problem Relation Age of Onset  . Heart disease Mother   . Heart attack Maternal Grandfather   . Stroke  Paternal Grandfather   . Heart disease Paternal Grandfather     Her Social History Is Significant For: History   Social History  . Marital Status: Married    Spouse Name: N/A  . Number of Children: 2  . Years of Education: 18+   Occupational History  . Attorney    Social History Main Topics  . Smoking status: Never Smoker   . Smokeless tobacco: Never Used  . Alcohol Use: No  . Drug Use: No  . Sexual Activity: Not on file   Other Topics Concern  . None   Social History Narrative   Commuter marriage between [place] and [place].    Consumes 1-2 cups of tea a day     Her Allergies Are:  Allergies   Allergen Reactions  . Bee Venom Anaphylaxis  . Amoxicillin   . Floxin [Ofloxacin]   . Gadolinium Derivatives Hives  . Hydromorphone Nausea And Vomiting  . Ibuprofen   . Iodinated Diagnostic Agents Swelling  . Iodine Hives  . Iohexol      Code: HIVES   . Prednisone Hives  . Tylenol [Acetaminophen] Hives  . Ultram [Tramadol] Hives  . Sulfur Rash  . Xarelto [Rivaroxaban] Rash  :   Her Current Medications Are:  Outpatient Encounter Prescriptions as of 09/04/2014  Medication Sig  . Albuterol Sulfate (VENTOLIN HFA IN) Inhale into the lungs as needed.  . carbamazepine (TEGRETOL) 200 MG tablet Take 1 tablet (200 mg total) by mouth 2 (two) times daily.  . carisoprodol (SOMA) 350 MG tablet Take 350 mg by mouth 3 (three) times daily.  . diphenhydrAMINE (BENADRYL) 50 MG tablet Take 50 mg by mouth every 6 (six) hours as needed for itching.  . docusate sodium (COLACE) 50 MG capsule Take 50 mg by mouth once.  . enoxaparin (LOVENOX) 100 MG/ML injection Inject 1 mL (100 mg total) into the skin every 12 (twelve) hours.  Marland Kitchen EPINEPHrine 0.3 mg/0.3 mL IJ SOAJ injection Inject 0.3 mLs (0.3 mg total) into the muscle once.  . hydrOXYzine (VISTARIL) 50 MG capsule Take 1 capsule (50 mg total) by mouth as needed.  Marland Kitchen LORazepam (ATIVAN) 2 MG tablet Take 1 tablet (2 mg total) by mouth as needed for anxiety.  Marland Kitchen morphine (MS CONTIN) 15 MG 12 hr tablet Take 15 mg by mouth every 8 (eight) hours.  Marland Kitchen oxyCODONE (OXY IR/ROXICODONE) 5 MG immediate release tablet Take 5 mg by mouth every 6 (six) hours as needed for severe pain.  Marland Kitchen propranolol (INDERAL) 20 MG tablet Take 20 mg by mouth 2 (two) times daily.  Marland Kitchen thyroid (ARMOUR) 60 MG tablet Take 1 tablet (60 mg total) by mouth 3 (three) times daily.  Marland Kitchen warfarin (COUMADIN) 5 MG tablet Take as directed by anticoagulation clinic  . zaleplon (SONATA) 10 MG capsule TAKE ONE CAPSULE BY MOUTH AT BEDTIME AS NEEDED FOR SLEEP  . zolpidem (AMBIEN) 5 MG tablet Take 1 tablet (5 mg  total) by mouth at bedtime.   No facility-administered encounter medications on file as of 09/04/2014.  :  Review of Systems:  Out of a complete 14 point review of systems, all are reviewed and negative with the exception of these symptoms as listed below:  Review of Systems  Constitutional: Positive for fatigue.       Weight gain   Cardiovascular: Positive for leg swelling.  Musculoskeletal:       Joint pain, swelling and aching muscles   Allergic/Immunologic: Positive for environmental allergies and food allergies.  Neurological: Positive for headaches.       Memory loss, confusion, Trouble falling asleep, wakes up many times during the night, no reported snoring or apneic events, denies choking, wakes up tired in the morning, daytime tiredness, denies taking naps during the day. Restless legs  Psychiatric/Behavioral:       Not enough sleep     Objective:  Neurologic Exam  Physical Exam Physical Examination:   Filed Vitals:   09/04/14 1011  BP: 146/98  Pulse: 72  Resp: 16   General Examination: The patient is a very pleasant 49 y.o. female in no acute distress. She appears well-developed and well-nourished and well groomed.   HEENT: Normocephalic, atraumatic, pupils are equal, round and reactive to light and accommodation. Funduscopic exam is normal with sharp disc margins noted. Extraocular tracking is good without limitation to gaze excursion or nystagmus noted. Normal smooth pursuit is noted. Hearing is grossly intact. Tympanic membranes are clear bilaterally. Face is symmetric with normal facial animation and normal facial sensation. Speech is clear with no dysarthria noted. There is no hypophonia. There is no lip, neck/head, jaw or voice tremor. Neck is supple with full range of passive and active motion. There are no carotid bruits on auscultation. Oropharynx exam reveals: mild mouth dryness, good dental hygiene and mild airway crowding, due to tonsils are 1+. Mallampati is  class I. Tongue protrudes centrally and palate elevates symmetrically. Neck size is 14-1/2 inches.   Chest: Clear to auscultation without wheezing, rhonchi or crackles noted.  Heart: S1+S2+0, regular and normal without murmurs, rubs or gallops noted.   Abdomen: Soft, non-tender and non-distended with normal bowel sounds appreciated on auscultation.  Extremities: There is no pitting edema in the distal lower extremities bilaterally. Pedal pulses are intact.  Skin: Warm and dry without trophic changes noted. There are no varicose veins.  Musculoskeletal: exam reveals no obvious joint deformities, tenderness or joint swelling or erythema.   Neurologically:  Mental status: The patient is awake, alert and oriented in all 4 spheres. Her immediate and remote memory, attention, language skills and fund of knowledge are appropriate. There is no evidence of aphasia, agnosia, apraxia or anomia. Speech is clear with normal prosody and enunciation. Thought process is linear. Mood is normal and affect is normal.  Cranial nerves II - XII are as described above under HEENT exam. In addition: shoulder shrug is normal with equal shoulder height noted. Motor exam: Normal bulk, strength and tone is noted. There is no drift, tremor or rebound. Romberg is negative. Reflexes are 2+ throughout. Babinski: Toes are flexor bilaterally. Fine motor skills and coordination: intact with normal finger taps, normal hand movements, normal rapid alternating patting, normal foot taps and normal foot agility.  Cerebellar testing: No dysmetria or intention tremor on finger to nose testing. Heel to shin is unremarkable bilaterally. There is no truncal or gait ataxia.  Sensory exam: intact to light touch, pinprick, vibration, temperature sense in the upper and lower extremities, with the exception of decreased pinprick sensation in the distal right leg.  Gait, station and balance: She stands easily. No veering to one side is noted. No  leaning to one side is noted. Posture is age-appropriate and stance is narrow based. Gait shows normal stride length and normal pace. No problems turning are noted. She turns en bloc. Tandem walk is unremarkable.                Assessment and Plan:   In summary, Melinda Ramirez is a  very pleasant 49 y.o.-year old female with an underlying medical history of DVT, hypothyroidism, obesity, history of hypoxemia, seen by Dr. Sherene SiresWert in pulmonology, who reports difficulty with her sleep for many years. For her chronic insomnia she is advised to follow-up with you and discuss a referral to psychiatry next. She may benefit from cognitive behavioral therapy. She has tried multiple medications and sometimes increased doses of Ambien which is not recommended. I do worry that she is on several sedating medications and also on chronic narcotic medications. She has certain risk factors for underlying obstructive sleep apnea including obesity, and taking potentially sedating medications including chronic narcotics. I suggested we proceed with a sleep study to look for an underlying organic sleep disorder such as significant PLMD or OSA.  I explained to her that I will not prescribe any sleeping pills for her. I recommend management of insomnia with primary care providers or referral to psychiatry. She is willing to proceed with a sleep study. We will call her with her test results and if need be arrange for follow-up. If she has obstructive sleep apnea I would recommend treatment with CPAP. I talked to her about obstructive sleep apnea and its management. She is advised not to drive when sleepy.  I answered all her questions today. We will call her to schedule her sleep study.   Thank you very much for allowing me to participate in the care of this nice patient. If I can be of any further assistance to you please do not hesitate to call me at [phone].  Sincerely,   Huston FoleySaima Jya Hughston, MD, PhD

## 2014-09-04 NOTE — Patient Instructions (Signed)
Patient was given prescription for home monitor.  Open lab order given to patient to have INR checked in [place] next week (09/10/14).  Patient verbalizes understanding.

## 2014-09-04 NOTE — Progress Notes (Signed)
Subjective:    Patient ID: Melinda Ramirez, female    DOB: [birth year], 49 y.o.   MRN: [id number]  HPI Patient presents today for follow up of elevated glucose, chronic DVT, HTN.  She reports that she is sleeping horribly and not oxygenating well. She continues to travel back and forth from [place] to [facility] and her medical care is divided. She is waiting on referral appointments in [place] with hematology and cardiology.   Obesity- continues to gain weight despite "trying to be more aware of food choices." Has had several elevated glucose readings. Unable to exercise much due to right ankle injury that requires repair. Reports that she can walk ok for short periods of time and that she has a support to wear, but doesn't have it on today.   HTN-She has recently started on propranolol for elevated bp and is taking 20 mg q hs. She was on higher dose but weaned down due to decreased oxygenation. She feels that her pain is causing her increased blood pressure. She has increased somnolence and decreased oxygenation with pain medications and has intermittent home pule ox readings 87-92.   Insomnia-Her sleep continues to be poor and she is seeing Dr. Golden Hurter today. She was taking [place] and lorazapam. Switched to [place], but she only sleeps 1- 1.5 hours a night. Insomnia started before pain started. She does not feel that ambien 5 mg works for her and does better at 10 mg dose. She "is trying to do what I am told." Patient denies feeling stressed and anxious. She reports that she can not meditate because she can not take time out of her day and she is unable to shut off her mind. She has uncontrolled thoughts racing through her mind at night, but denies this is a cause of her insomnia.   DVT/anticoagulation- Last INR 08/06/14 sub therapeutic at 1.5. Coumadin dose increased. She has appointment at coumadin clinic today and is concerned that they will be frustrated with her because of the lack of communication from the lab  in [place] where she reports 2 INR results that were subtherapeutic. She would like to adjust her coumadin dosage based on what she eats and feels that she is expected to "follow some algorithm." She feels it is unrealistic to expect her to eat a consistent diet with her frequent travel. She is also interested in a home INR monitor. She has compression stockings that she reports to wear regularly. She is not wearing them today.   Review of Systems As above in HPI    Objective:   Physical Exam  Constitutional: She is oriented to person, place, and time. She appears well-developed and well-nourished. No distress.  obese  HENT:  Head: Normocephalic and atraumatic.  Eyes: Conjunctivae are normal.  Neck: Normal range of motion. Neck supple.  Cardiovascular: Normal rate, regular rhythm and normal heart sounds.   Pulmonary/Chest: Effort normal and breath sounds normal.  Musculoskeletal: Normal range of motion. She exhibits no edema or tenderness.  Lymphadenopathy:    She has no cervical adenopathy.  Neurological: She is alert and oriented to person, place, and time.  Skin: Skin is warm and dry. She is not diaphoretic.  Psychiatric:  Patient appears fatigued. Having difficulty staying on topic. Tearful at times. Denies suicidal/homicidal ideation.   Vitals reviewed.  BP 134/94 mmHg  Pulse 75  Temp(Src) 98 F (36.7 C) (Oral)  Resp 16  Ht  (1.651 m)  Wt 220 lb 9.6 oz (100.064 kg)  BMI 36.71 kg/m2  SpO2 97%  LMP 09/01/2014 Wt Readings from Last 3 Encounters:  09/04/14 220 lb 9.6 oz (100.064 kg)  07/12/14 214 lb 12.8 oz (97.433 kg)  07/10/14 212 lb (96.163 kg)   Results for orders placed or performed in visit on 09/04/14  POCT glycosylated hemoglobin (Hb A1C)  Result Value Ref Range   Hemoglobin A1C 5.4   Had prolonged discussion with patient regarding prioritizing current problems and putting focus on her health. Encouraged patient to see pulmonary either in [place] or [place] ASAP for  ongoing hypoxic episodes. She has seen Dr. Sherene SiresWert in the past and we can get her in to [facility] if needed. Patient appears reluctant to make any changes and states that her frequent travel between [facility] [place] and [place] is non-negotiable as her job is here and her husband is in [place].     Assessment & Plan:   1. Elevated blood sugar - POCT glycosylated hemoglobin (Hb A1C)  2. Sleep disorder - Patient seeing neurology later today  3. DVT (deep venous thrombosis), unspecified laterality - continue coumadin follow up with coumadin clinic as scheduled  4. BMI 36.0-36.9,adult - discussed difficulty loosing weight under current circumstances with poor sleep, travel and inability to exercise.    Olean Reeeborah Gessner, FNP-BC  [facility]  09/04/2014 1:48 PM

## 2014-09-04 NOTE — Patient Instructions (Signed)
For your chronic insomnia, I recommend, you follow up with your Primary care provider.   We will request a sleep study to look for an underlying organic sleep disorder such as obstructive sleep apnea or periodic leg movements in sleep. I would be happy to manage any organic sleep disorder. Chronic insomnia is best managed with the help of your primary care providers or discuss with them of referral to psychiatry for medical management and also consideration for nonpharmacological approaches such as cognitive behavioral therapy.  Please remember to try to maintain good sleep hygiene, which means: Keep a regular sleep and wake schedule, try not to exercise or have a meal within 2 hours of your bedtime, try to keep your bedroom conducive for sleep, that is, cool and dark, without light distractors such as an illuminated alarm clock, and refrain from watching TV right before sleep or in the middle of the night and do not keep the TV or radio on during the night. Also, try not to use or play on electronic devices at bedtime, such as your cell phone, tablet PC or laptop. If you like to read at bedtime on an electronic device, try to dim the background light as much as possible. Do not eat in the middle of the night.   Please remember, the risks and ramifications of moderate to severe obstructive sleep apnea or OSA are: Cardiovascular disease, including congestive heart failure, stroke, difficult to control hypertension, arrhythmias, and even type 2 diabetes has been linked to untreated OSA. Sleep apnea causes disruption of sleep and sleep deprivation in most cases, which, in turn, can cause recurrent headaches, problems with memory, mood, concentration, focus, and vigilance. Most people with untreated sleep apnea report excessive daytime sleepiness, which can affect their ability to drive. Please do not drive if you feel sleepy.   Our sleep lab administrative assistant, Leanord Hawkinglycia will call you to schedule your sleep  study. If you don't hear back from her by next week please feel free to call her at [phone]. This is her direct line and please leave a message with your phone number to call back if you get the voicemail box.

## 2014-09-04 NOTE — Progress Notes (Signed)
Pre visit review using our clinic review tool, if applicable. No additional management support is needed unless otherwise documented below in the visit note. 

## 2014-09-04 NOTE — Progress Notes (Signed)
I have reviewed and agree with the plan. Patient has continued to self-adjust her own dose of coumadin based on what she is eating making it difficult to obtain a standard regimen of medication. Home POCT INR machine prescribed secondary to patient's continued travel. Will call the results to the Coumadin Clinic for adjustments. Discussed importance of not changing regimen independently as this could lead to harm or excessive bleeding.

## 2014-09-05 ENCOUNTER — Telehealth: Payer: Self-pay | Admitting: Family Medicine

## 2014-09-05 NOTE — Telephone Encounter (Signed)
Attempted to call patient to discuss her recent neurology visit and evaluation for sleep disturbance. Left her my number and asked her to return my call.

## 2014-09-05 NOTE — Telephone Encounter (Signed)
Prescription called to patient's pharmacy. 

## 2014-09-16 ENCOUNTER — Telehealth: Payer: Self-pay | Admitting: Family Medicine

## 2014-09-16 ENCOUNTER — Encounter: Payer: Self-pay | Admitting: Family Medicine

## 2014-09-16 NOTE — Telephone Encounter (Signed)
Thank you Debbie for all your hard work with Shanda Bumps

## 2014-09-16 NOTE — Telephone Encounter (Signed)
Had lengthy conversation with patient regarding her chronic insomnia and recent neurology visit. Patient expressed frustration about the outcome of her neuro visit and was hoping for more guidance than she received. She is awaiting a sleep study and is aware of the recommendation that she see a psychiatrist for prescribing of her sleep medications. She is currently in [place] until the middle of next month and reports that she has been trying to get an appointment with pulmonary either there or at [facility] as was recommended by her pain specialist. She has agreed to make an appointment with Dr. Geannie Risen and his number was provided to her. I discussed with her at length the potential for side effects with extended use of sleep medications. She said that she understands the risks and is afraid that she will be left without anything and won't get even the small amount of sleep she is currently getting. She had her INR checked recently and reports it is closer to therapeutic and her coumadin doses are being adjusted by someone in [place]. She reports that her pulse ox has been running 95-97 and she has not had recent hypoxia. I agreed to one more refill of her sleep medication and lorazepam with the understanding that she will be seen by someone else for management of her sleep issues. Once again, I recommended she sign up for [facility] My[facility] to facilitate communication, she declined saying that she already has two accounts with two other health systems and does not wish to create more computer problems by signing up for another account.

## 2014-10-02 ENCOUNTER — Other Ambulatory Visit: Payer: Self-pay | Admitting: Family Medicine

## 2014-10-03 NOTE — Telephone Encounter (Signed)
Hi Debbie I just wasn't sure this was one of the drugs we told Shanda BumpsJessica that we could refill

## 2014-10-03 NOTE — Telephone Encounter (Signed)
Patient was told she would be given one more refill so she would have time to make appointment with psychiatrist.

## 2014-10-14 ENCOUNTER — Institutional Professional Consult (permissible substitution): Payer: 59 | Admitting: Neurology

## 2014-10-14 ENCOUNTER — Ambulatory Visit (INDEPENDENT_AMBULATORY_CARE_PROVIDER_SITE_OTHER): Payer: 59 | Admitting: Emergency Medicine

## 2014-10-14 VITALS — BP 138/80 | HR 73 | Temp 97.7°F | Resp 15 | Ht 65.0 in | Wt 213.0 lb

## 2014-10-14 DIAGNOSIS — S81851A Open bite, right lower leg, initial encounter: Secondary | ICD-10-CM

## 2014-10-14 LAB — POCT CBC
Granulocyte percent: 47.4 %G (ref 37–80)
HCT, POC: 44.2 % (ref 37.7–47.9)
Hemoglobin: 14.5 g/dL (ref 12.2–16.2)
Lymph, poc: 2.4 (ref 0.6–3.4)
MCH, POC: 29.1 pg (ref 27–31.2)
MCHC: 32.9 g/dL (ref 31.8–35.4)
MCV: 88.3 fL (ref 80–97)
MID (CBC): 0.4 (ref 0–0.9)
MPV: 6.4 fL (ref 0–99.8)
PLATELET COUNT, POC: 238 10*3/uL (ref 142–424)
POC GRANULOCYTE: 2.5 (ref 2–6.9)
POC LYMPH PERCENT: 45.3 %L (ref 10–50)
POC MID %: 7.3 %M (ref 0–12)
RBC: 5.01 M/uL (ref 4.04–5.48)
RDW, POC: 13.2 %
WBC: 5.2 10*3/uL (ref 4.6–10.2)

## 2014-10-14 MED ORDER — BETAMETHASONE DIPROPIONATE AUG 0.05 % EX CREA: TOPICAL_CREAM | Freq: Two times a day (BID) | CUTANEOUS | Status: DC

## 2014-10-14 NOTE — Patient Instructions (Signed)
I will contact  Dr Frances FurbishAthar  regarding sleep referral. Please use the cream twice a day. I will call once I have your CBC result.. Please contact your mental health provider regarding what medications they feel our safe for your anxiety disorder

## 2014-10-14 NOTE — Progress Notes (Signed)
Subjective:  This chart was scribed for Melinda LitesSteve Shandell Jallow, MD by [facility] Ramirez, medical scribe at [facility].The patient was seen in exam room 09 and the patient's care was started at 2:01 PM.   Patient ID: Melinda Ramirez, female    DOB: 04-29-1965, 49 y.o.   MRN: [id number] Chief Complaint  Patient presents with  . Follow-up    Insomnia  . Insect Bite    Onset 2 days/ spreading   HPI HPI Comments: Melinda Ramirez Bonnie is a 49 y.o. female who presents to [facility] complaining of an insect bite on her right lower extremity [place]. She is unsure what insect bit or stung her. The redness is gradually growing in size. The area is throbbing and her leg feels tight. She has used benadryl, hydrocortisone cream and she has iced the area.  Sleep clinic said they do not treat chronic insomnia, then seen by a psychiatrist they do not treat chronic insomnia. Taken Ambien in the past which helped her. Oxygen has been stable for 5 weeks INR was normal one month ago, no longer dizzy, no loss of memory. She is currently waiting for a sleep study.  Review of Systems  HENT: Positive for ear pain.   Skin: Positive for color change.       Positive for insect bite.  Neurological: Negative for dizziness.  Psychiatric/Behavioral: Positive for sleep disturbance.      Objective:  BP 138/80 mmHg  Pulse 73  Temp(Src) 97.7 F (36.5 C) (Oral)  Resp 15  Ht 5\' 5"  (1.651 Ramirez)  Wt 213 lb (96.616 kg)  BMI 35.44 kg/m2  SpO2 97%  LMP 10/04/2014 Physical Exam  Vitals reviewed. CONSTITUTIONAL: Well developed/well nourished HEAD: Normocephalic/atraumatic EYES: EOMI/PERRL ENMT: Mucous membranes moist NECK: supple no meningeal signs SPINE/BACK:entire spine nontender CV: S1/S2 noted, no murmurs/rubs/gallops noted LUNGS: Lungs are clear to auscultation bilaterally, no apparent distress ABDOMEN: soft, nontender, no rebound or guarding, bowel  sounds noted throughout abdomen GU:no cva tenderness NEURO: Pt is awake/alert/appropriate, moves all extremitiesx4. No facial droop.   EXTREMITIES: pulses normal/equal, full ROM SKIN: warm, color normal, red tender area on her right shin. PSYCH: no abnormalities of mood noted, alert and oriented to situation.     Results for orders placed or performed in visit on 10/14/14  POCT CBC  Result Value Ref Range   WBC 5.2 4.6 - 10.2 K/uL   Lymph, poc 2.4 0.6 - 3.4   POC LYMPH PERCENT 45.3 10 - 50 %L   MID (cbc) 0.4 0 - 0.9   POC MID % 7.3 0 - 12 %Ramirez   POC Granulocyte 2.5 2 - 6.9   Granulocyte percent 47.4 37 - 80 %G   RBC 5.01 4.04 - 5.48 Ramirez/uL   Hemoglobin 14.5 12.2 - 16.2 g/dL   HCT, POC 14.744.2 82.937.7 - 47.9 %   MCV 88.3 80 - 97 fL   MCH, POC 29.1 27 - 31.2 pg   MCHC 32.9 31.8 - 35.4 g/dL   RDW, POC 56.213.2 %   Platelet Count, POC 238 142 - 424 K/uL   MPV 6.4 0 - 99.8 fL   Assessment & Plan:  Her white count is normal she is afebrile she was giving a prescription for Diprolene for the red area on her right shin. No prescriptions were written today. She will continue her follow-ups with the nurse practitioner at Dr. Jerrel IvoryBraden's office.  I will contact Dr.Athar regarding her recommendations for who she feels would be appropriate to write for sleep medications in this patient. She is scheduled for a sleep study in the near future. No prescriptions were written today. I told her if she decided not to return to work I would help her with her disability claim.

## 2014-10-18 ENCOUNTER — Telehealth: Payer: Self-pay

## 2014-10-18 ENCOUNTER — Ambulatory Visit: Payer: 59

## 2014-10-18 NOTE — Telephone Encounter (Signed)
DAUB - Pt came in today for a follow up visit, but could not wait.  Please call her at [phone]

## 2014-10-18 NOTE — Telephone Encounter (Signed)
Called and spoke to pt. She said what she was following up on today was "complicated" and she would rather talk directly to you.

## 2014-10-19 ENCOUNTER — Telehealth: Payer: Self-pay | Admitting: Emergency Medicine

## 2014-10-19 NOTE — Telephone Encounter (Signed)
I called and had to leave a message. I advised Shanda BumpsJessica to call me on Monday and I will try and answer her questions at that time.

## 2014-11-07 ENCOUNTER — Ambulatory Visit (INDEPENDENT_AMBULATORY_CARE_PROVIDER_SITE_OTHER): Payer: 59 | Admitting: Neurology

## 2014-11-07 DIAGNOSIS — G4733 Obstructive sleep apnea (adult) (pediatric): Secondary | ICD-10-CM | POA: Diagnosis not present

## 2014-11-07 DIAGNOSIS — G4720 Circadian rhythm sleep disorder, unspecified type: Secondary | ICD-10-CM

## 2014-11-08 NOTE — Sleep Study (Signed)
Please see the scanned sleep study interpretation located in the Procedure tab within the Chart Review section. 

## 2014-11-14 ENCOUNTER — Telehealth: Payer: Self-pay | Admitting: Neurology

## 2014-11-14 NOTE — Telephone Encounter (Signed)
Patient seen on 09/04/14, PSG on 11/07/14. Please call and notify the patient that the recent sleep study did not show any significant obstructive sleep apnea. She has only borderline obstructive sleep apnea, when in REM/dream sleep. For this, sleeping on her sides (rather than back) and weight loss is recommended.  Furthermore, please re-iterate good sleep hygiene to her, which means: Keep a regular sleep and wake schedule, try not to exercise or have a meal within 2 hours of your bedtime, try to keep your bedroom conducive for sleep, that is, cool and dark, without light distractors such as an illuminated alarm clock, and refrain from watching TV right before sleep or in the middle of the night and do not keep the TV or radio on during the night. Also, try not to use or play on electronic devices at bedtime, such as your cell phone, tablet PC or laptop. If you like to read at bedtime on an electronic device, try to dim the background light as much as possible. Do not eat in the middle of the night.   She can follow up with her PCP from hear on onwards, as discussed during her clinic appointment in June. Also, route or fax report to PCP and referring MD, if other than PCP.  Once you have spoken to patient, you can close this encounter.   Thanks,  Huston Foley, MD, PhD [facility])

## 2014-11-15 NOTE — Telephone Encounter (Signed)
Left message to call back  

## 2014-11-22 NOTE — Telephone Encounter (Signed)
Results and recommendations mailed to patient.

## 2014-11-22 NOTE — Telephone Encounter (Signed)
Left message to call back for results

## 2014-11-26 NOTE — Telephone Encounter (Signed)
Pt called about sleep study results. I relayed to her that a letter has been mailed to her with her results and to call if she has questions.

## 2014-12-26 ENCOUNTER — Ambulatory Visit: Payer: Self-pay | Admitting: General Practice

## 2015-01-07 ENCOUNTER — Encounter: Payer: Self-pay | Admitting: Emergency Medicine

## 2015-02-10 IMAGING — CR DG CHEST 2V
2 series · 2 of 2 positions shown · non-contrast
Comparison: None.

CLINICAL DATA: Dyspnea.

EXAM:
CHEST  2 VIEW

[view not recorded (1 of 2)]
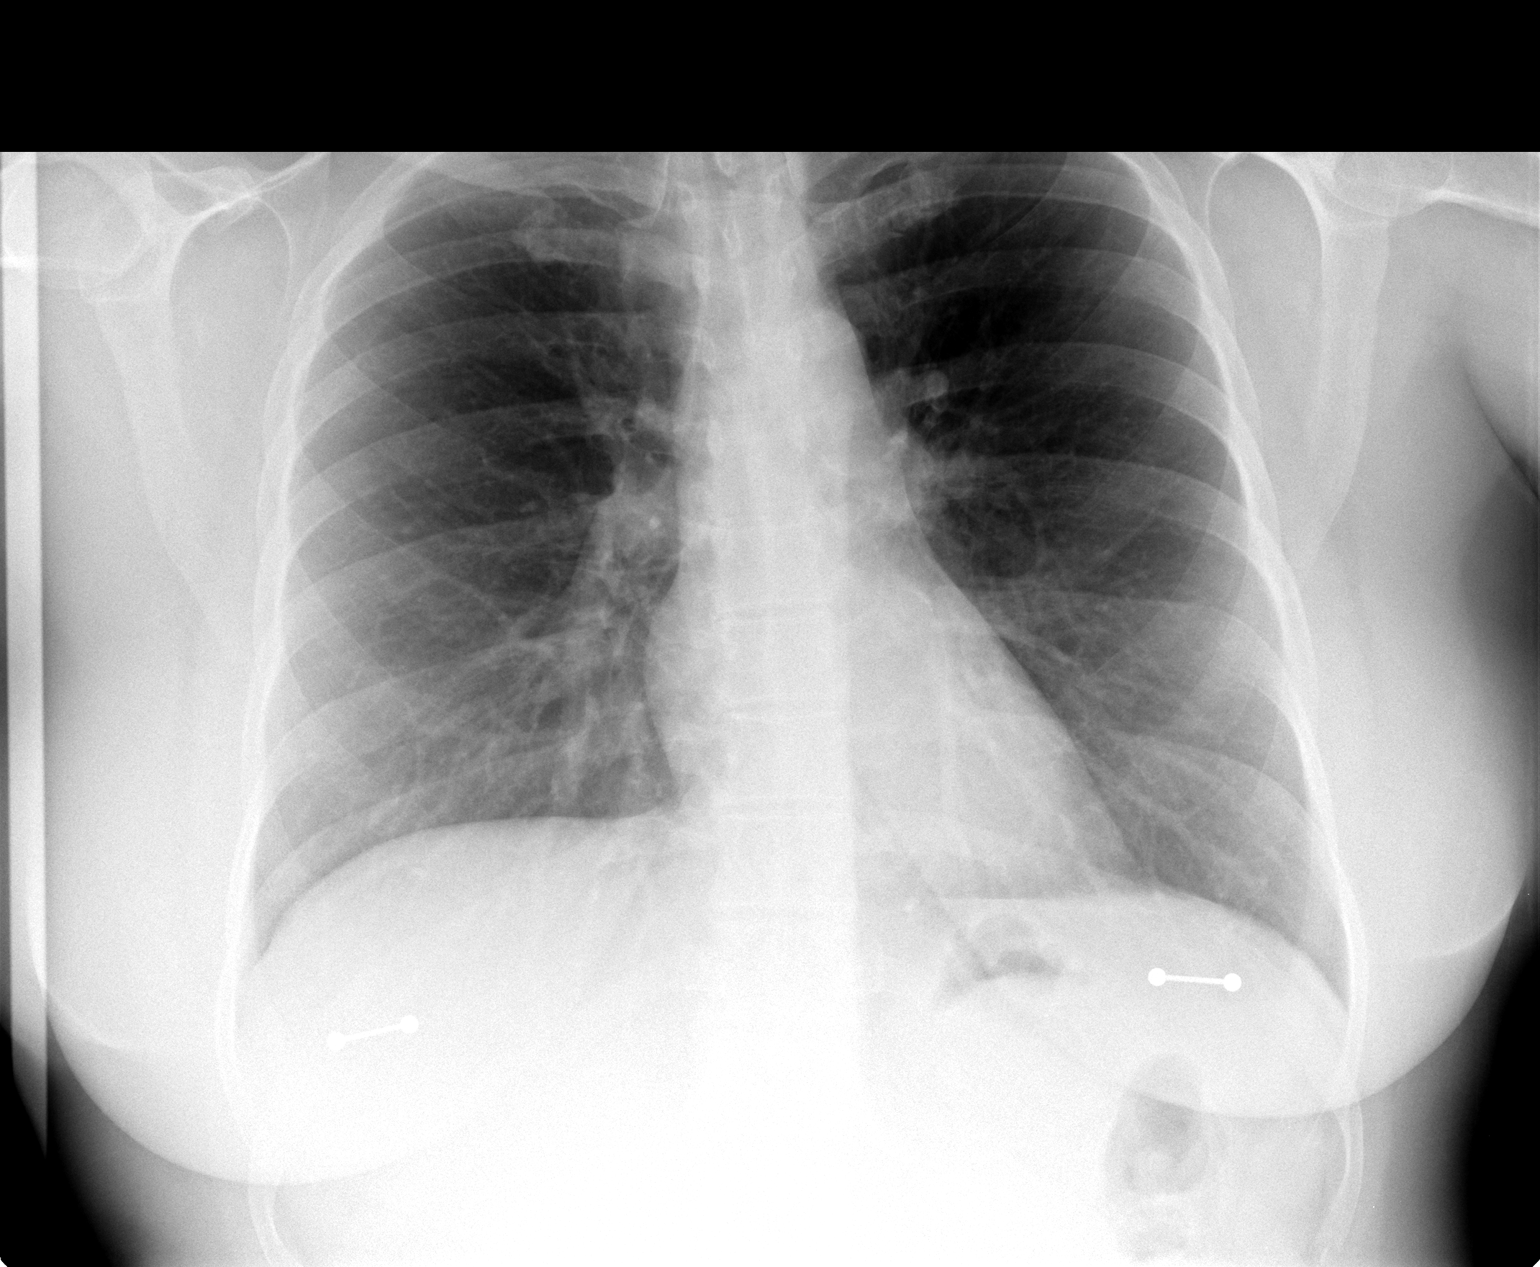

[view not recorded (2 of 2)]
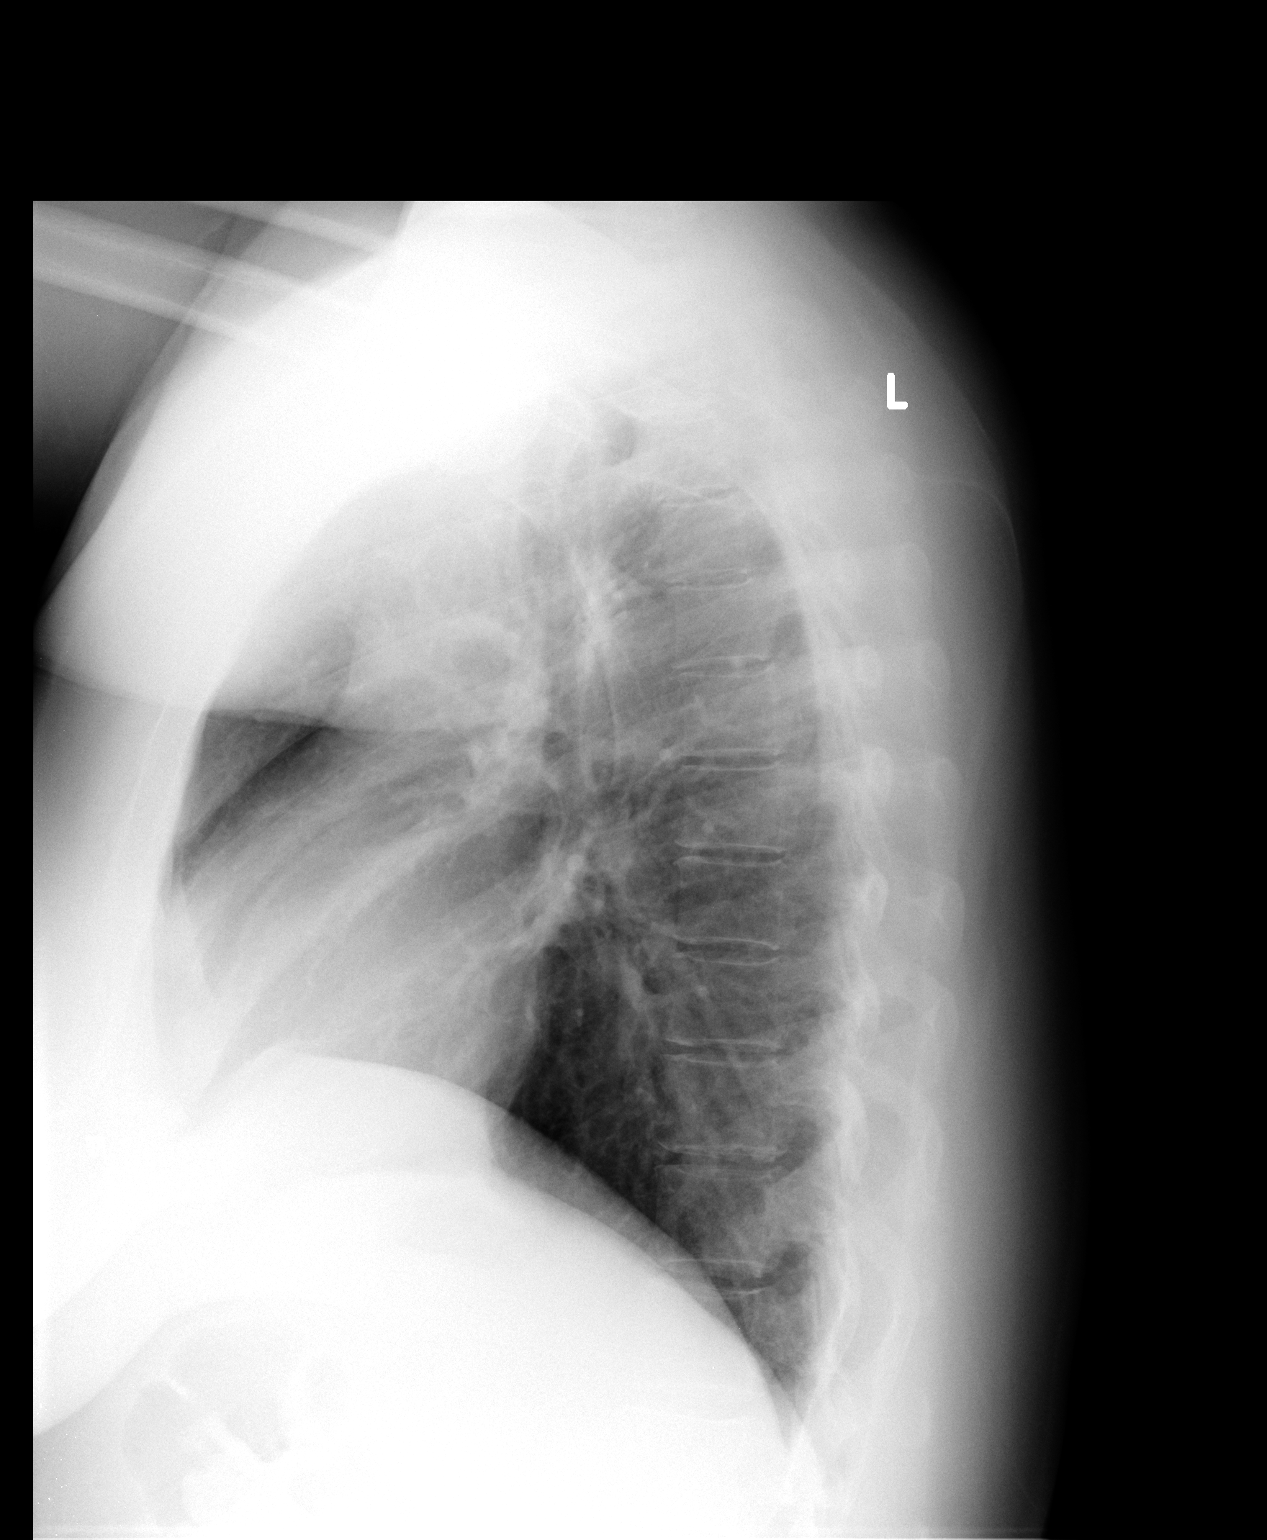

[2 of 2 positions shown; findings below may reference images not displayed]

FINDINGS: The heart size and mediastinal contours are within normal limits.
Both lungs are clear. The visualized skeletal structures are
unremarkable.
IMPRESSION: Normal chest.

## 2015-03-08 ENCOUNTER — Other Ambulatory Visit: Payer: Self-pay | Admitting: Emergency Medicine

## 2015-03-13 ENCOUNTER — Other Ambulatory Visit: Payer: Self-pay

## 2015-03-13 DIAGNOSIS — M797 Fibromyalgia: Secondary | ICD-10-CM

## 2015-03-13 MED ORDER — CARBAMAZEPINE 200 MG PO TABS: 200.0000 mg | ORAL_TABLET | Freq: Two times a day (BID) | ORAL | Status: DC

## 2015-04-29 ENCOUNTER — Telehealth: Payer: Self-pay

## 2015-04-29 MED ORDER — THYROID 60 MG PO TABS: 60.0000 mg | ORAL_TABLET | Freq: Three times a day (TID) | ORAL | Status: DC

## 2015-04-29 NOTE — Telephone Encounter (Signed)
Pt called to req RF of armour thyroid. Discussed need for f/up and pt agrees to RTC when she returns from [place] next week. I will send in a 30 day supply to her pharm in [place] so she doesn't run out.

## 2015-05-07 ENCOUNTER — Ambulatory Visit (INDEPENDENT_AMBULATORY_CARE_PROVIDER_SITE_OTHER): Payer: 59 | Admitting: Emergency Medicine

## 2015-05-07 VITALS — BP 130/82 | HR 69 | Temp 98.8°F | Resp 20 | Ht 65.35 in | Wt 232.6 lb

## 2015-05-07 DIAGNOSIS — R21 Rash and other nonspecific skin eruption: Secondary | ICD-10-CM

## 2015-05-07 DIAGNOSIS — R7309 Other abnormal glucose: Secondary | ICD-10-CM

## 2015-05-07 DIAGNOSIS — E039 Hypothyroidism, unspecified: Secondary | ICD-10-CM | POA: Diagnosis not present

## 2015-05-07 DIAGNOSIS — G4700 Insomnia, unspecified: Secondary | ICD-10-CM | POA: Diagnosis not present

## 2015-05-07 DIAGNOSIS — R0902 Hypoxemia: Secondary | ICD-10-CM

## 2015-05-07 DIAGNOSIS — R739 Hyperglycemia, unspecified: Secondary | ICD-10-CM

## 2015-05-07 LAB — THYROID PANEL WITH TSH
FREE THYROXINE INDEX: 1.2 — AB (ref 1.4–3.8)
T3 Uptake: 28 % (ref 22–35)
T4, Total: 4.2 ug/dL — ABNORMAL LOW (ref 4.5–12.0)
TSH: 1.075 u[IU]/mL (ref 0.350–4.500)

## 2015-05-07 LAB — GLUCOSE, POCT (MANUAL RESULT ENTRY): POC Glucose: 80 mg/dl (ref 70–99)

## 2015-05-07 LAB — POCT SKIN KOH: Skin KOH, POC: NEGATIVE

## 2015-05-07 NOTE — Progress Notes (Signed)
Patient ID: Melinda Ramirez, female   DOB: 01/03/1966, 50 y.o.   MRN: [id number]    By signing my name below, I, Melinda Ramirez, attest that this documentation has been prepared under the direction and in the presence of Melinda Gobble, MD Electronically Signed: Charline Ramirez, ED Scribe 05/07/2015 at 12:35 PM.  Chief Complaint:  Chief Complaint  Patient presents with  . Follow-up    pneumonia   HPI: Melinda Ramirez is a 50 y.o. female who reports to [facility] today for multiple complaints.   Right Ankle Pain Pt states that she has dislocated her right ankle twice in the past year. She has put off ankle reconstruction surgery.   Dermatology Seen by dermatology 5 years ago for recurrent red areas to her right forehead, below her right eye and left shoulder. She reports mild pain to the areas with palpation.   Difficulty Sleeping  Pt has an upcoming sleep study by a neurologic sleep specialist on 05/15/15 in [place].   Currently separated from her husband who lives in [place].  Past Medical History  Diagnosis Date  . Allergy   . Blood transfusion without reported diagnosis   . Clotting disorder (HCC)   . Hypertension   . Thyroid disease   . CFS (chronic fatigue syndrome)   . Spondylolysis   . Headache   . DVT (deep venous thrombosis) [facility])    Past Surgical History  Procedure Laterality Date  . Appendectomy    . Joint replacement    . Ivc Ramirez placement      Social History   Social History  . Marital Status: Married    Spouse Name: N/A  . Number of Children: 2  . Years of Education: 18+   Occupational History  . Attorney    Social History Main Topics  . Smoking status: Never Smoker   . Smokeless tobacco: Never Used  . Alcohol Use: No  . Drug Use: No  . Sexual Activity: Not Asked   Other Topics Concern  . None   Social History Narrative   Commuter marriage between [place] and [place].    Consumes 1-2 cups of tea a day    Family History  Problem Relation Age of Onset  . Heart  disease Mother   . Heart attack Maternal Grandfather   . Stroke Paternal Grandfather   . Heart disease Paternal Grandfather    Allergies  Allergen Reactions  . Bee Venom Anaphylaxis  . Amoxicillin   . Floxin [Ofloxacin]   . Gadolinium Derivatives Hives  . Hydromorphone Nausea And Vomiting  . Ibuprofen   . Iodinated Diagnostic Agents Swelling  . Iodine Hives  . Iohexol      Code: HIVES   . Prednisone Hives  . Tylenol [Acetaminophen] Hives  . Ultram [Tramadol] Hives  . Sulfur Rash  . Xarelto [Rivaroxaban] Rash   Prior to Admission medications   Medication Sig Start Date End Date Taking? Authorizing Provider  carbamazepine (TEGRETOL) 200 MG tablet Take 1 tablet (200 mg total) by mouth 2 (two) times daily. PATIENT NEEDS OFFICE VISIT FOR ADDITIONAL REFILLS 03/13/15  Yes Melinda Gobble, MD  carisoprodol (SOMA) 350 MG tablet Take 350 mg by mouth 3 (three) times daily.   Yes Historical Provider, MD  diphenhydrAMINE (BENADRYL) 50 MG tablet Take 50 mg by mouth every 6 (six) hours as needed for itching.   Yes Historical Provider, MD  docusate sodium (COLACE) 50 MG capsule Take 50 mg by mouth once.   Yes  Historical Provider, MD  enoxaparin (LOVENOX) 100 MG/ML injection Inject 1 mL (100 mg total) into the skin every 12 (twelve) hours. 07/10/14  Yes Melinda Belfast, FNP  EPINEPHrine 0.3 mg/0.3 mL IJ SOAJ injection Inject 0.3 mLs (0.3 mg total) into the muscle once. 07/10/14  Yes Melinda Belfast, FNP  hydrOXYzine (VISTARIL) 50 MG capsule Take 1 capsule (50 mg total) by mouth as needed. 07/10/14  Yes Melinda Belfast, FNP  morphine (MS CONTIN) 15 MG 12 hr tablet Take 15 mg by mouth every 8 (eight) hours.   Yes Historical Provider, MD  oxyCODONE (OXY IR/ROXICODONE) 5 MG immediate release tablet Take 5 mg by mouth every 6 (six) hours as needed for severe pain.   Yes Historical Provider, MD  propranolol (INDERAL) 20 MG tablet Take 20 mg by mouth 2 (two) times daily. 07/17/14  Yes Historical Provider, MD    thyroid (ARMOUR THYROID) 60 MG tablet Take 1 tablet (60 mg total) by mouth 3 (three) times daily. 04/29/15  Yes Melinda Gobble, MD  warfarin (COUMADIN) 5 MG tablet Take as directed by anticoagulation clinic 08/06/14  Yes Melinda Speak, FNP  Albuterol Sulfate (VENTOLIN HFA IN) Inhale into the lungs as needed. Reported on 05/07/2015    Historical Provider, MD  augmented betamethasone dipropionate (DIPROLENE AF) 0.05 % cream Apply topically 2 (two) times daily. Patient not taking: Reported on 05/07/2015 10/14/14   Melinda Gobble, MD  LORazepam (ATIVAN) 2 MG tablet Take 1 tablet (2 mg total) by mouth as needed for anxiety. Patient not taking: Reported on 05/07/2015 07/10/14   Melinda Belfast, FNP  zaleplon (SONATA) 10 MG capsule TAKE ONE CAPSULE BY MOUTH EVERY NIGHT AT BEDTIME AS NEEDED FOR SLEEP Patient not taking: Reported on 05/07/2015 10/03/14   Melinda Gobble, MD  zolpidem (AMBIEN) 5 MG tablet Take 1 tablet (5 mg total) by mouth at bedtime. Patient not taking: Reported on 05/07/2015 01/16/14   Melinda Gobble, MD   ROS: The patient denies fevers, chills, night sweats, unintentional weight loss, chest pain, palpitations, wheezing, dyspnea on exertion, nausea, vomiting, abdominal pain, dysuria, hematuria, melena, numbness, weakness, or tingling. +arthralgias, +sleep difficulty   All other systems have been reviewed and were otherwise negative with the exception of those mentioned in the HPI and as above.    PHYSICAL EXAM: Filed Vitals:   05/07/15 1159  BP: 130/82  Pulse: 69  Temp: 98.8 F (37.1 C)  Resp: 20   Body mass index is 38.29 kg/(m^2).  General: Alert, no acute distress HEENT:  Normocephalic, atraumatic, oropharynx patent.  Eye: Nonie Ramirez [facility] Cardiovascular:  Regular rate and rhythm, no rubs murmurs or gallops. No Carotid bruits, radial pulse intact. No pedal edema.  Respiratory: Clear to auscultation bilaterally. No wheezes, rales, or rhonchi. No cyanosis, no use of accessory  musculature Abdominal: No organomegaly, abdomen is soft and non-tender, positive bowel sounds. No masses. Musculoskeletal: Gait intact. No edema, tenderness Skin: 1 cm scaly red area R frontal scalp. Similar area beneath R eye and 1 area to L anterior chest.  Neurologic: Facial musculature symmetric. Psychiatric: Patient acts appropriately throughout our interaction. Lymphatic: No cervical or submandibular lymphadenopathy  LABS:  EKG/XRAY:   Primary read interpreted by Dr. Cleta Alberts at [facility].  ASSESSMENT/PLAN: She is scheduled to see her sleep specialist up in [place] next week.She is currently separated from her husband. Thyroid studies were done today.  No other changes were made.I personally performed the services described in this documentation, which  was scribed in my presence. The recorded information has been reviewed and is accurate.    Gross sideeffects, risk and benefits, and alternatives of medications d/w patient. Patient is aware that all medications have potential sideeffects and we are unable to predict every sideeffect or drug-drug interaction that may occur.  Lesle Chris MD 05/07/2015 12:24 PM

## 2015-05-08 ENCOUNTER — Other Ambulatory Visit: Payer: Self-pay | Admitting: *Deleted

## 2015-05-08 ENCOUNTER — Other Ambulatory Visit: Payer: Self-pay | Admitting: Emergency Medicine

## 2015-05-08 MED ORDER — THYROID 60 MG PO TABS: 60.0000 mg | ORAL_TABLET | Freq: Three times a day (TID) | ORAL | Status: DC

## 2015-05-09 ENCOUNTER — Other Ambulatory Visit: Payer: Self-pay | Admitting: Emergency Medicine

## 2015-05-10 NOTE — Telephone Encounter (Signed)
Patient states was just here, I do not see tegretol discussed.  Can we refill?

## 2015-05-10 NOTE — Telephone Encounter (Signed)
Palpation I did go ahead and refill her Tegretol. Please talk with her and find out why she is on the Tegretol.

## 2015-05-12 ENCOUNTER — Other Ambulatory Visit: Payer: Self-pay

## 2015-05-12 MED ORDER — THYROID 60 MG PO TABS: 60.0000 mg | ORAL_TABLET | Freq: Three times a day (TID) | ORAL | Status: DC

## 2015-07-03 ENCOUNTER — Ambulatory Visit (INDEPENDENT_AMBULATORY_CARE_PROVIDER_SITE_OTHER): Payer: 59 | Admitting: Family Medicine

## 2015-07-03 VITALS — BP 118/82 | HR 75 | Temp 98.2°F | Resp 18 | Ht 63.5 in | Wt 235.4 lb

## 2015-07-03 DIAGNOSIS — I10 Essential (primary) hypertension: Secondary | ICD-10-CM

## 2015-07-03 DIAGNOSIS — M797 Fibromyalgia: Secondary | ICD-10-CM

## 2015-07-03 MED ORDER — GABAPENTIN 100 MG PO CAPS: ORAL_CAPSULE | ORAL | Status: AC

## 2015-07-03 MED ORDER — PROPRANOLOL HCL 20 MG PO TABS: 20.0000 mg | ORAL_TABLET | Freq: Two times a day (BID) | ORAL | Status: DC

## 2015-07-03 MED ORDER — CARBAMAZEPINE 200 MG PO TABS: 200.0000 mg | ORAL_TABLET | Freq: Two times a day (BID) | ORAL | Status: DC

## 2015-07-03 NOTE — Progress Notes (Signed)
Subjective:    Patient ID: Melinda Ramirez, female    DOB: 12-25-1965, 50 y.o.   MRN: [id number]  HPI This is a pleasant 50 yo female who presents today for refill of carbamazepine and propranolol.   She has separated from her husband, but is still going up [place] to help take care of her mother in law who has aggressive lymphoma. Her daughter is at [facility] and her son is at [facility].   She saw a neurological sleep specialist and had a sleep study that was inconclusive due to lack of sleep. She has had increasing fatigue over the last 6 months. Has intermittent feelings of illness (lasts 3 days). She is no longer on ambien, lorazepam. Feels like generalized pain really interferes with sleep.   Continues to have problems with ankle, unable to exercise due to chronic pain. Is planning to swim more when weather improves. Does some stretching and walking with a friend.  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  She is working on having a realistic approach and doing what she can when she can. She has been working on her weight and feels like she is stabilized. She is contemplating applying for disability again.   INR recently checked in [place].   Past Medical History  Diagnosis Date  . Allergy   . Blood transfusion without reported diagnosis   . Clotting disorder (HCC)   . Hypertension   . Thyroid disease   . CFS (chronic fatigue syndrome)   . Spondylolysis   . Headache   . DVT (deep venous thrombosis) [facility])    Past Surgical History  Procedure Laterality Date  . Appendectomy    . Joint replacement    . Ivc filter placement      Family History  Problem Relation Age of Onset  . Heart disease Mother   . Heart attack Maternal Grandfather   . Stroke Paternal Grandfather   . Heart disease Paternal Grandfather    Social History  Substance Use Topics  . Smoking status: Never Smoker   . Smokeless tobacco: Never Used  . Alcohol  Use: No      Review of Systems Per HPI    Objective:   Physical Exam Physical Exam  Vitals reviewed. Constitutional: Oriented to person, place, and time. Appears well-developed and well-nourished. Obese.  HENT:  Head: Normocephalic and atraumatic.  Eyes: Conjunctivae are normal.  Neck: Normal range of motion. Neck supple.  Cardiovascular: Normal rate.   Pulmonary/Chest: Effort normal.  Musculoskeletal: Normal range of motion.  Neurological: Alert and oriented to person, place, and time.  Skin: Skin is warm and dry.  Psychiatric: Normal mood and affect. Behavior is normal. Judgment and thought content normal.   BP 118/82 mmHg  Pulse 75  Temp(Src) 98.2 F (36.8 C) (Oral)  Resp 18  Ht 5' 3.5" (1.613 m)  Wt 235 lb 6.4 oz (106.777 kg)  BMI 41.04 kg/m2  SpO2 94%  LMP 06/27/2015 Wt Readings from Last 3 Encounters:  07/03/15 235 lb 6.4 oz (106.777 kg)  05/07/15 232 lb 9.6 oz (105.507 kg)  10/14/14 213 lb (96.616 kg)       Assessment & Plan:  1. Fibromyalgia - carbamazepine (TEGRETOL) 200 MG tablet; Take 1 tablet (200 mg total) by mouth 2 (two) times daily.  Dispense: 180 tablet; Refill: 1 - gabapentin (NEURONTIN) 100 MG capsule; Take 1 tablet at bedtime for 7 days then  take 1 tablet twice a day  Dispense: 180 capsule; Refill: 1 - asked her to contact me via Mychart to let me know how she is doing in the next couple of weeks  2. Essential hypertension - propranolol (INDERAL) 20 MG tablet; Take 1 tablet (20 mg total) by mouth 2 (two) times daily.  Dispense: 180 tablet; Refill: 1   Melinda Reeeborah Cathey [place], FNP-BC  [facility], [facility]  07/03/2015 9:03 AM

## 2015-07-03 NOTE — Patient Instructions (Signed)
     IF you received an x-ray today, you will receive an invoice from [facility]. Please contact [facility] at [phone] with questions or concerns regarding your invoice.   IF you received labwork today, you will receive an invoice from [facility]. Please contact [facility] at [phone] with questions or concerns regarding your invoice.   Our billing staff will not be able to assist you with questions regarding bills from these companies.  You will be contacted with the lab results as soon as they are available. The fastest way to get your results is to activate your My Chart account. Instructions are located on the last page of this paperwork. If you have not heard from us regarding the results in 2 weeks, please contact this office.      

## 2015-12-31 ENCOUNTER — Telehealth: Payer: Self-pay

## 2015-12-31 DIAGNOSIS — M797 Fibromyalgia: Secondary | ICD-10-CM

## 2015-12-31 DIAGNOSIS — I10 Essential (primary) hypertension: Secondary | ICD-10-CM

## 2015-12-31 NOTE — Telephone Encounter (Signed)
PATIENT WOULD LIKE TO GET ANOTHER DOCTOR TO REFILL HER MEDICATIONS FOR 1 MONTH UNTIL SHE CAN GET HER MAIL-ORDER INFORMATION. (DR. DAUB WAS HER PCP.) SHE HAS CHANGED INSURANCE COMPANIES AND HER MAIL-IN PHARMACY. 1 MONTH WILL GIVE HER TIME TO GET IT STRAIGHTENED OUT. SHE NEEDS PROPRANOLOL 20 MG, CARBAMAZEPINE 200 MG AND ARMOUR THYROID 60 MG. BEST PHONE [phone] (CELL)  PHARMACY CHOICE IS [facility] ON [address].  MBC

## 2016-01-05 MED ORDER — CARBAMAZEPINE 200 MG PO TABS: 200.0000 mg | ORAL_TABLET | Freq: Two times a day (BID) | ORAL | 0 refills | Status: AC

## 2016-01-05 MED ORDER — PROPRANOLOL HCL 20 MG PO TABS: 20.0000 mg | ORAL_TABLET | Freq: Two times a day (BID) | ORAL | 0 refills | Status: AC

## 2016-01-05 MED ORDER — THYROID 60 MG PO TABS: 60.0000 mg | ORAL_TABLET | Freq: Three times a day (TID) | ORAL | 0 refills | Status: DC

## 2016-01-05 NOTE — Telephone Encounter (Signed)
Called pt to verify pharm since that [facility] has closed. Pt stated that she is in [place] helping her mother in law through chemo and gave me a pharm up there to send them to. Sent in RFs.

## 2016-02-11 ENCOUNTER — Other Ambulatory Visit: Payer: Self-pay | Admitting: Urgent Care

## 2016-02-27 ENCOUNTER — Other Ambulatory Visit: Payer: Self-pay | Admitting: Emergency Medicine

## 2018-01-24 ENCOUNTER — Ambulatory Visit: Payer: 59 | Admitting: Obstetrics & Gynecology

## 2018-05-12 ENCOUNTER — Encounter: Payer: 59 | Admitting: Obstetrics & Gynecology

## 2019-04-25 ENCOUNTER — Encounter: Payer: Self-pay | Admitting: Obstetrics & Gynecology

## 2019-04-30 ENCOUNTER — Encounter: Payer: Self-pay | Admitting: Obstetrics & Gynecology

## 2019-07-23 ENCOUNTER — Encounter: Payer: Self-pay | Admitting: Obstetrics & Gynecology

## 2019-08-06 ENCOUNTER — Encounter: Payer: Self-pay | Admitting: Obstetrics & Gynecology

## 2019-10-05 ENCOUNTER — Ambulatory Visit: Payer: 59 | Admitting: Infectious Diseases
